# Patient Record
Sex: Male | Born: 1970 | Race: Black or African American | Hispanic: No | Marital: Married | Smoking: Current every day smoker
Health system: Southern US, Community
[De-identification: ages and names within clinical notes are randomized; demographics above are authoritative.]

## PROBLEM LIST (undated history)

## (undated) DIAGNOSIS — E785 Hyperlipidemia, unspecified: Secondary | ICD-10-CM

## (undated) DIAGNOSIS — L700 Acne vulgaris: Secondary | ICD-10-CM

## (undated) DIAGNOSIS — I10 Essential (primary) hypertension: Secondary | ICD-10-CM

## (undated) DIAGNOSIS — B019 Varicella without complication: Secondary | ICD-10-CM

## (undated) HISTORY — DX: Hyperlipidemia, unspecified: E78.5

## (undated) HISTORY — DX: Essential (primary) hypertension: I10

## (undated) HISTORY — PX: PILONIDAL CYST EXCISION: SHX744

## (undated) HISTORY — DX: Varicella without complication: B01.9

## (undated) HISTORY — DX: Acne vulgaris: L70.0

---

## 2004-06-21 ENCOUNTER — Ambulatory Visit: Payer: Self-pay | Admitting: Family Medicine

## 2004-07-05 ENCOUNTER — Ambulatory Visit: Payer: Self-pay | Admitting: Family Medicine

## 2006-05-14 ENCOUNTER — Ambulatory Visit: Payer: Self-pay | Admitting: Family Medicine

## 2007-04-29 ENCOUNTER — Ambulatory Visit: Payer: Self-pay | Admitting: Family Medicine

## 2007-04-29 DIAGNOSIS — I10 Essential (primary) hypertension: Secondary | ICD-10-CM | POA: Insufficient documentation

## 2007-04-29 DIAGNOSIS — Z9189 Other specified personal risk factors, not elsewhere classified: Secondary | ICD-10-CM | POA: Insufficient documentation

## 2007-04-29 DIAGNOSIS — H109 Unspecified conjunctivitis: Secondary | ICD-10-CM | POA: Insufficient documentation

## 2007-05-22 ENCOUNTER — Telehealth: Payer: Self-pay | Admitting: Family Medicine

## 2007-06-10 ENCOUNTER — Ambulatory Visit: Payer: Self-pay | Admitting: Family Medicine

## 2007-06-10 LAB — CONVERTED CEMR LAB
Glucose, Urine, Semiquant: NEGATIVE
Specific Gravity, Urine: 1.02

## 2007-06-11 ENCOUNTER — Telehealth: Payer: Self-pay | Admitting: Family Medicine

## 2007-06-11 LAB — CONVERTED CEMR LAB
BUN: 14 mg/dL (ref 6–23)
Basophils Relative: 0.3 % (ref 0.0–1.0)
Bilirubin, Direct: 0.2 mg/dL (ref 0.0–0.3)
CO2: 31 meq/L (ref 19–32)
Calcium: 9.9 mg/dL (ref 8.4–10.5)
Cholesterol: 219 mg/dL (ref 0–200)
Eosinophils Absolute: 0.2 10*3/uL (ref 0.0–0.6)
Eosinophils Relative: 2.7 % (ref 0.0–5.0)
GFR calc Af Amer: 96 mL/min
GFR calc non Af Amer: 80 mL/min
Glucose, Bld: 112 mg/dL — ABNORMAL HIGH (ref 70–99)
HDL: 32.1 mg/dL — ABNORMAL LOW (ref >39.0)
Hemoglobin: 14.7 g/dL (ref 13.0–17.0)
Lymphocytes Relative: 30.6 % (ref 12.0–46.0)
MCV: 88.7 fL (ref 78.0–100.0)
Monocytes Absolute: 0.8 10*3/uL — ABNORMAL HIGH (ref 0.2–0.7)
Monocytes Relative: 9.2 % (ref 3.0–11.0)
Neutro Abs: 4.8 10*3/uL (ref 1.4–7.7)
Platelets: 298 10*3/uL (ref 150–400)
Potassium: 4.2 meq/L (ref 3.5–5.1)
TSH: 1.32 microintl units/mL (ref 0.35–5.50)
Total Protein: 7.1 g/dL (ref 6.0–8.3)
VLDL: 31 mg/dL (ref 0–40)

## 2007-06-17 ENCOUNTER — Ambulatory Visit: Payer: Self-pay | Admitting: Family Medicine

## 2007-06-17 DIAGNOSIS — E1169 Type 2 diabetes mellitus with other specified complication: Secondary | ICD-10-CM | POA: Insufficient documentation

## 2007-06-17 DIAGNOSIS — E785 Hyperlipidemia, unspecified: Secondary | ICD-10-CM

## 2007-07-09 ENCOUNTER — Ambulatory Visit: Payer: Self-pay | Admitting: Family Medicine

## 2007-11-28 ENCOUNTER — Ambulatory Visit: Payer: Self-pay | Admitting: Family Medicine

## 2007-11-28 DIAGNOSIS — L723 Sebaceous cyst: Secondary | ICD-10-CM | POA: Insufficient documentation

## 2007-12-03 ENCOUNTER — Ambulatory Visit: Payer: Self-pay | Admitting: Family Medicine

## 2007-12-05 LAB — CONVERTED CEMR LAB
Cholesterol: 211 mg/dL (ref 0–200)
Triglycerides: 320 mg/dL (ref 0–149)

## 2008-05-14 ENCOUNTER — Ambulatory Visit: Payer: Self-pay | Admitting: Family Medicine

## 2008-05-14 DIAGNOSIS — E119 Type 2 diabetes mellitus without complications: Secondary | ICD-10-CM | POA: Insufficient documentation

## 2008-07-16 ENCOUNTER — Ambulatory Visit: Payer: Self-pay | Admitting: Family Medicine

## 2008-07-21 LAB — CONVERTED CEMR LAB
ALT: 26 units/L (ref 0–53)
Alkaline Phosphatase: 94 units/L (ref 39–117)
Basophils Absolute: 0 10*3/uL (ref 0.0–0.1)
Bilirubin, Direct: 0.1 mg/dL (ref 0.0–0.3)
CO2: 31 meq/L (ref 19–32)
Calcium: 10.2 mg/dL (ref 8.4–10.5)
Chloride: 105 meq/L (ref 96–112)
Cholesterol: 156 mg/dL (ref 0–200)
HDL: 32.3 mg/dL — ABNORMAL LOW (ref >39.0)
LDL Cholesterol: 102 mg/dL — ABNORMAL HIGH (ref 0–99)
Lymphocytes Relative: 29.6 % (ref 12.0–46.0)
MCHC: 34.8 g/dL (ref 30.0–36.0)
Microalb Creat Ratio: 4.4 mg/g (ref 0.0–30.0)
Microalb, Ur: 1.4 mg/dL (ref 0.0–1.9)
Neutro Abs: 5.5 10*3/uL (ref 1.4–7.7)
Neutrophils Relative %: 60.6 % (ref 43.0–77.0)
Platelets: 272 10*3/uL (ref 150–400)
Potassium: 3.9 meq/L (ref 3.5–5.1)
RDW: 12.7 % (ref 11.5–14.6)
Sodium: 142 meq/L (ref 135–145)
TSH: 1.06 microintl units/mL (ref 0.35–5.50)
Total Bilirubin: 1.2 mg/dL (ref 0.3–1.2)
Total CHOL/HDL Ratio: 4.8
Triglycerides: 108 mg/dL (ref 0–149)
VLDL: 22 mg/dL (ref 0–40)

## 2009-10-18 ENCOUNTER — Ambulatory Visit: Payer: Self-pay | Admitting: Family Medicine

## 2009-10-31 ENCOUNTER — Telehealth: Payer: Self-pay | Admitting: Family Medicine

## 2010-04-25 ENCOUNTER — Telehealth: Payer: Self-pay | Admitting: Family Medicine

## 2010-06-23 ENCOUNTER — Telehealth: Payer: Self-pay | Admitting: Family Medicine

## 2010-08-04 ENCOUNTER — Telehealth: Payer: Self-pay | Admitting: Family Medicine

## 2010-10-03 NOTE — Assessment & Plan Note (Signed)
Summary: high blood pressure/cdw   Vital Signs:  Patient profile:   40 year old male Weight:      274 pounds Temp:     98.2 degrees F oral Pulse rate:   70 / minute BP sitting:   184 / 122  (left arm) Cuff size:   large  Vitals Entered By: Alfred Levins, CMA (October 18, 2009 1:02 PM) CC: hypertension, not taking meds because he can't afford it   History of Present Illness: here to follow up on HTN. He lost his job over a year ago, and of course his health insurance as well. he stopped taking his meds some months ago. His BP at a pre-employment exam recently was over 200 systolic. he admits to some mild lightheadedness and HAs lately. No chest pains or SOB.   Current Medications (verified): 1)  Hydrochlorothiazide 25 Mg Tabs (Hydrochlorothiazide) .... Once Daily 2)  Azor 10-40 Mg  Tabs (Amlodipine-Olmesartan) .... Once Daily 3)  Zocor 40 Mg  Tabs (Simvastatin) .Marland Kitchen.. 1 By Mouth Once Daily  Allergies (verified): No Known Drug Allergies  Past History:  Past Medical History: Reviewed history from 07/16/2008 and no changes required. Hypertension Hyperlipidemia Diabetes mellitus, type II acne vulgaris  Review of Systems  The patient denies anorexia, fever, weight loss, weight gain, vision loss, decreased hearing, hoarseness, chest pain, syncope, dyspnea on exertion, peripheral edema, prolonged cough, hemoptysis, abdominal pain, melena, hematochezia, severe indigestion/heartburn, hematuria, incontinence, genital sores, muscle weakness, suspicious skin lesions, transient blindness, difficulty walking, depression, unusual weight change, abnormal bleeding, enlarged lymph nodes, angioedema, breast masses, and testicular masses.    Physical Exam  General:  Well-developed,well-nourished,in no acute distress; alert,appropriate and cooperative throughout examination Neck:  No deformities, masses, or tenderness noted. Lungs:  Normal respiratory effort, chest expands symmetrically. Lungs  are clear to auscultation, no crackles or wheezes. Heart:  Normal rate and regular rhythm. S1 and S2 normal without gallop, murmur, click, rub or other extra sounds.   Impression & Recommendations:  Problem # 1:  HYPERTENSION (ICD-401.9)  His updated medication list for this problem includes:    Hydrochlorothiazide 25 Mg Tabs (Hydrochlorothiazide) ..... Once daily    Azor 10-40 Mg Tabs (Amlodipine-olmesartan) ..... Once daily  Complete Medication List: 1)  Hydrochlorothiazide 25 Mg Tabs (Hydrochlorothiazide) .... Once daily 2)  Azor 10-40 Mg Tabs (Amlodipine-olmesartan) .... Once daily 3)  Zocor 40 Mg Tabs (Simvastatin) .Marland Kitchen.. 1 by mouth once daily  Patient Instructions: 1)  Given samples for Azor and he will get his other meds at the pharmacy since they are generic.  2)  Please schedule a follow-up appointment in 3 months . When he can afford it, he needs a cpx and labs. Prescriptions: AZOR 10-40 MG  TABS (AMLODIPINE-OLMESARTAN) once daily  #90 x 0   Entered by:   Alfred Levins, CMA   Authorized by:   Nelwyn Salisbury MD   Signed by:   Alfred Levins, CMA on 10/18/2009   Method used:   Samples Given   RxID:   [id number]

## 2010-10-03 NOTE — Progress Notes (Signed)
Summary: rx azor   Phone Note Call from Patient   Summary of Call: Samples of Azor. [phone] Initial call taken by: Lynann Beaver CMA AAMA,  June 23, 2010 3:58 PM  Follow-up for Phone Call        samples out front  pt aware.  pt requested rx sent to [facility] [address]  Follow-up by: Pura Spice, RN,  June 23, 2010 4:06 PM    New/Updated Medications: AZOR 10-40 MG  TABS (AMLODIPINE-OLMESARTAN) once daily needs to be seen Prescriptions: AZOR 10-40 MG  TABS (AMLODIPINE-OLMESARTAN) once daily needs to be seen  #30 x 0   Entered by:   Pura Spice, RN   Authorized by:   Nelwyn Salisbury MD   Signed by:   Pura Spice, RN on 06/23/2010   Method used:   Electronically to        [facility] [address] (retail)       [phone]       [place], [place]  [address]       Ph: [phone]       Fax: [phone]   RxID:   [id number]

## 2010-10-03 NOTE — Progress Notes (Signed)
Summary: samples  Phone Note Call from Patient Call back at [phone]   Caller: Patient Summary of Call: asking for samples of Azor 10/40  Follow-up for Phone Call        please find him some samples Follow-up by: Nelwyn Salisbury MD,  April 26, 2010 1:30 PM  Additional Follow-up for Phone Call Additional follow up Details #1::        called. Additional Follow-up by: Raechel Ache, RN,  April 26, 2010 1:43 PM

## 2010-10-03 NOTE — Progress Notes (Signed)
Summary: refill doxycycline  Phone Note From Pharmacy   Caller: [facility] [address] Call For: Clent Ridges  Summary of Call: refill doxycycline 100mg  1 by mouth two times a day Initial call taken by: Alfred Levins, CMA,  October 31, 2009 11:57 AM  Follow-up for Phone Call        call in #60 with 2 rf Follow-up by: Nelwyn Salisbury MD,  October 31, 2009 4:47 PM  Additional Follow-up for Phone Call Additional follow up Details #1::        Phone call completed, Pharmacist called Additional Follow-up by: Alfred Levins, CMA,  October 31, 2009 4:56 PM    New/Updated Medications: DOXYCYCLINE HYCLATE 100 MG CAPS (DOXYCYCLINE HYCLATE) Take 1 tab twice a day Prescriptions: DOXYCYCLINE HYCLATE 100 MG CAPS (DOXYCYCLINE HYCLATE) Take 1 tab twice a day  #60 x 2   Entered by:   Alfred Levins, CMA   Authorized by:   Nelwyn Salisbury MD   Signed by:   Alfred Levins, CMA on 10/31/2009   Method used:   Electronically to        [facility] [address] (retail)       [phone]       [place], [place]  [address]       Ph: [phone]       Fax: [phone]   RxID:   [id number]

## 2010-10-03 NOTE — Progress Notes (Signed)
Summary: switch med  Phone Note Call from Patient   Caller: Patient Call For: Nelwyn Salisbury MD Summary of Call: Pt would like equivalent of azor 10-40 he is willing to take 2 pills instead of one pill due to cost, please call [facility] Initial call taken by: Heron Sabins,  August 04, 2010 11:09 AM  Follow-up for Phone Call        stop Azor. Call in Amlodipine 10 mg once daily and Losartan 100 mg once daily , #30 of each with 11 rf  Follow-up by: Nelwyn Salisbury MD,  August 04, 2010 11:42 AM  Additional Follow-up for Phone Call Additional follow up Details #1::        Not able to inform pt, phone no longer in service Additional Follow-up by: Sid Falcon LPN,  August 04, 2010 2:08 PM    New/Updated Medications: LOSARTAN POTASSIUM 100 MG TABS (LOSARTAN POTASSIUM) once daily AMLODIPINE BESYLATE 10 MG TABS (AMLODIPINE BESYLATE) once daily Prescriptions: LOSARTAN POTASSIUM 100 MG TABS (LOSARTAN POTASSIUM) once daily  #30 x 11   Entered by:   Sid Falcon LPN   Authorized by:   Nelwyn Salisbury MD   Signed by:   Sid Falcon LPN on 13/24/4010   Method used:   Electronically to        [facility] [address] (retail)       [phone]       [place], [place]  [address]       Ph: [phone]       Fax: [phone]   RxID:   [id number] AMLODIPINE BESYLATE 10 MG TABS (AMLODIPINE BESYLATE) once daily  #30 x 11   Entered by:   Sid Falcon LPN   Authorized by:   Nelwyn Salisbury MD   Signed by:   Sid Falcon LPN on 09/32/3557   Method used:   Electronically to        [facility] [address] (retail)       [phone]       [place], [place]  [address]       Ph: [phone]       Fax: [phone]   RxID:   [id number]

## 2010-11-16 ENCOUNTER — Other Ambulatory Visit: Payer: Self-pay | Admitting: Family Medicine

## 2010-12-25 ENCOUNTER — Telehealth: Payer: Self-pay | Admitting: Family Medicine

## 2010-12-25 NOTE — Telephone Encounter (Signed)
Rx refill request for: Doxycycline Hyc 100mg  Cap #60 Please Advise.

## 2010-12-26 NOTE — Telephone Encounter (Signed)
We need to know what this is for

## 2010-12-26 NOTE — Telephone Encounter (Signed)
Pharmacy states Pt p/u partial order on original order & is trying now to p/u remaining portion on refill. Pharmacy advised of Denial on this order & inform Pt that OV would be needed before ABI Rx could be filled.

## 2011-04-09 ENCOUNTER — Other Ambulatory Visit: Payer: Self-pay | Admitting: Family Medicine

## 2011-05-16 ENCOUNTER — Other Ambulatory Visit: Payer: Self-pay | Admitting: Family Medicine

## 2011-05-17 NOTE — Telephone Encounter (Signed)
Electronic refill request for HCTZ & Zocor. Last office visit was on 10/2009 and pt has a non working phone number. Please advise?

## 2011-05-18 ENCOUNTER — Telehealth: Payer: Self-pay | Admitting: Family Medicine

## 2011-05-18 MED ORDER — SIMVASTATIN 40 MG PO TABS: 40.0000 mg | ORAL_TABLET | Freq: Every day | ORAL | Status: DC

## 2011-05-18 MED ORDER — HYDROCHLOROTHIAZIDE 25 MG PO TABS: 25.0000 mg | ORAL_TABLET | Freq: Every day | ORAL | Status: DC

## 2011-05-18 NOTE — Telephone Encounter (Signed)
Refill request for Simvastatin, script sent e-scribe and pt needs a office visit. I tried to call but no working #.

## 2011-05-18 NOTE — Telephone Encounter (Signed)
Refill request for HCTZ, script sent e-scribe. Pt needs a office visit, I tried to call but no working #.

## 2011-06-25 ENCOUNTER — Other Ambulatory Visit: Payer: Self-pay | Admitting: Family Medicine

## 2011-06-26 NOTE — Telephone Encounter (Signed)
Can we refill? 

## 2011-07-07 ENCOUNTER — Other Ambulatory Visit: Payer: Self-pay | Admitting: Family Medicine

## 2011-07-24 ENCOUNTER — Telehealth: Payer: Self-pay | Admitting: Family Medicine

## 2011-07-24 NOTE — Telephone Encounter (Signed)
Pt i scheduled for a meds check on 07/31/11 but is currently out of hydrochlorothiazide (HYDRODIURIL) 25 MG tablet and simvastatin (ZOCOR) 40 MG tablet pt requesting refill   [facility] [address]

## 2011-07-25 MED ORDER — HYDROCHLOROTHIAZIDE 25 MG PO TABS: 25.0000 mg | ORAL_TABLET | Freq: Every day | ORAL | Status: DC

## 2011-07-25 MED ORDER — SIMVASTATIN 40 MG PO TABS: 40.0000 mg | ORAL_TABLET | Freq: Every day | ORAL | Status: DC

## 2011-07-25 NOTE — Telephone Encounter (Signed)
rx sent into pharmacy

## 2011-07-31 ENCOUNTER — Ambulatory Visit: Payer: Self-pay | Admitting: Family Medicine

## 2011-08-07 ENCOUNTER — Ambulatory Visit: Payer: Self-pay | Admitting: Family Medicine

## 2011-09-12 ENCOUNTER — Other Ambulatory Visit: Payer: Self-pay | Admitting: Family Medicine

## 2011-09-13 NOTE — Telephone Encounter (Signed)
Call in one month of each with no further refills. He needs an OV for any more

## 2011-09-13 NOTE — Telephone Encounter (Signed)
Pt last seen 10/18/09. Pls advise.

## 2011-11-07 ENCOUNTER — Telehealth: Payer: Self-pay | Admitting: Family Medicine

## 2011-11-07 NOTE — Telephone Encounter (Signed)
Pt has sch ov for 11/26/11 to see pcp. Pt is going to need refills for amLODipine (NORVASC) 10 MG tablet ,hydrochlorothiazide (HYDRODIURIL) 25 MG tablet ,losartan (COZAAR) 100 MG tablet ,simvastatin (ZOCOR) 40 MG tablet to [facility] [address].

## 2011-11-12 MED ORDER — LOSARTAN POTASSIUM 100 MG PO TABS: 100.0000 mg | ORAL_TABLET | Freq: Every day | ORAL | Status: DC

## 2011-11-12 MED ORDER — HYDROCHLOROTHIAZIDE 25 MG PO TABS: 25.0000 mg | ORAL_TABLET | Freq: Every day | ORAL | Status: DC

## 2011-11-12 MED ORDER — SIMVASTATIN 40 MG PO TABS: 40.0000 mg | ORAL_TABLET | Freq: Every day | ORAL | Status: DC

## 2011-11-12 MED ORDER — AMLODIPINE BESYLATE 10 MG PO TABS: 10.0000 mg | ORAL_TABLET | Freq: Every day | ORAL | Status: DC

## 2011-11-12 NOTE — Telephone Encounter (Signed)
All 3 scripts sent e-scribe, Dr. Clent Ridges did approve this.

## 2011-11-26 ENCOUNTER — Encounter: Payer: Self-pay | Admitting: Family Medicine

## 2011-11-26 ENCOUNTER — Ambulatory Visit: Payer: Self-pay | Admitting: Family Medicine

## 2011-11-26 DIAGNOSIS — Z0289 Encounter for other administrative examinations: Secondary | ICD-10-CM

## 2013-01-09 ENCOUNTER — Other Ambulatory Visit: Payer: Self-pay

## 2013-01-09 ENCOUNTER — Encounter (HOSPITAL_BASED_OUTPATIENT_CLINIC_OR_DEPARTMENT_OTHER): Payer: Self-pay | Admitting: *Deleted

## 2013-01-09 ENCOUNTER — Emergency Department (HOSPITAL_BASED_OUTPATIENT_CLINIC_OR_DEPARTMENT_OTHER)
Admission: EM | Admit: 2013-01-09 | Discharge: 2013-01-09 | Disposition: A | Discharge status: Home / Self Care | Payer: Self-pay | Attending: Emergency Medicine | Admitting: Emergency Medicine

## 2013-01-09 DIAGNOSIS — Z91199 Patient's noncompliance with other medical treatment and regimen due to unspecified reason: Secondary | ICD-10-CM | POA: Insufficient documentation

## 2013-01-09 DIAGNOSIS — Z9119 Patient's noncompliance with other medical treatment and regimen: Secondary | ICD-10-CM | POA: Insufficient documentation

## 2013-01-09 DIAGNOSIS — F1021 Alcohol dependence, in remission: Secondary | ICD-10-CM | POA: Insufficient documentation

## 2013-01-09 DIAGNOSIS — I10 Essential (primary) hypertension: Secondary | ICD-10-CM | POA: Insufficient documentation

## 2013-01-09 DIAGNOSIS — F17200 Nicotine dependence, unspecified, uncomplicated: Secondary | ICD-10-CM | POA: Insufficient documentation

## 2013-01-09 DIAGNOSIS — Z9114 Patient's other noncompliance with medication regimen: Secondary | ICD-10-CM

## 2013-01-09 DIAGNOSIS — R42 Dizziness and giddiness: Secondary | ICD-10-CM | POA: Insufficient documentation

## 2013-01-09 DIAGNOSIS — E119 Type 2 diabetes mellitus without complications: Secondary | ICD-10-CM | POA: Insufficient documentation

## 2013-01-09 DIAGNOSIS — Z79899 Other long term (current) drug therapy: Secondary | ICD-10-CM | POA: Insufficient documentation

## 2013-01-09 DIAGNOSIS — E785 Hyperlipidemia, unspecified: Secondary | ICD-10-CM | POA: Insufficient documentation

## 2013-01-09 MED ORDER — AMLODIPINE BESYLATE 5 MG PO TABS
10.0000 mg | ORAL_TABLET | Freq: Once | ORAL | Status: AC
  Administered 2013-01-09: 10 mg via ORAL
  Filled 2013-01-09: qty 2

## 2013-01-09 MED ORDER — HYDROCHLOROTHIAZIDE 25 MG PO TABS
25.0000 mg | ORAL_TABLET | Freq: Once | ORAL | Status: AC
  Administered 2013-01-09: 25 mg via ORAL
  Filled 2013-01-09: qty 1

## 2013-01-09 MED ORDER — LOSARTAN POTASSIUM-HCTZ 100-25 MG PO TABS: 1.0000 | ORAL_TABLET | Freq: Every day | ORAL | Status: DC

## 2013-01-09 MED ORDER — AMLODIPINE BESYLATE 5 MG PO TABS: 5.0000 mg | ORAL_TABLET | Freq: Every day | ORAL | Status: DC

## 2013-01-09 MED ORDER — LOSARTAN POTASSIUM 50 MG PO TABS
100.0000 mg | ORAL_TABLET | Freq: Once | ORAL | Status: DC
  Filled 2013-01-09: qty 2

## 2013-01-09 NOTE — ED Provider Notes (Signed)
History     CSN: [id number]  Arrival date & time 01/09/13  1204   First MD Initiated Contact with Patient 01/09/13 1235      Chief Complaint  Patient presents with  . Hypertension    (Consider location/radiation/quality/duration/timing/severity/associated sxs/prior treatment) The history is provided by the patient.   patient reports medication noncompliance with his hypertension meds.  Today he felt slightly more lightheaded when running to the top of the steps.  He also felt "dizzy".  This was described more as lightheadedness.  No vertiginous symptoms.  No difficulty with stability her gait.  He denies chest pain or shortness of breath.  He denies abdominal pain.  No nausea vomiting or diarrhea.  No new exertional shortness of breath.  No orthopnea.  No prior history of cardiac disease.  He is currently working 2 jobs and states that his sleep habits have been poor.  His symptoms are mild.  Nothing improves or worsens her symptoms.  Past Medical History  Diagnosis Date  . Hypertension   . Hyperlipidemia   . Diabetes mellitus   . Acne vulgaris     Past Surgical History  Procedure Laterality Date  . Pilonidal cyst excision      removal    Family History  Problem Relation Age of Onset  . Alcohol abuse    . Diabetes    . Hyperlipidemia    . Hypertension    . Stroke      History  Substance Use Topics  . Smoking status: Current Every Day Smoker -- 1.00 packs/day    Types: Cigarettes  . Smokeless tobacco: Never Used  . Alcohol Use: Yes     Comment: socially      Review of Systems  All other systems reviewed and are negative.    Allergies  Review of patient's allergies indicates no known allergies.  Home Medications   Current Outpatient Rx  Name  Route  Sig  Dispense  Refill  . amLODipine (NORVASC) 10 MG tablet   Oral   Take 1 tablet (10 mg total) by mouth daily.   30 tablet   0   . hydrochlorothiazide (HYDRODIURIL) 25 MG tablet   Oral   Take 1 tablet  (25 mg total) by mouth daily.   30 tablet   0   . losartan (COZAAR) 100 MG tablet   Oral   Take 1 tablet (100 mg total) by mouth daily.   30 tablet   0   . simvastatin (ZOCOR) 40 MG tablet   Oral   Take 1 tablet (40 mg total) by mouth daily.   30 tablet   0     Must be seen for future refills     BP 187/133  Temp(Src) 98.6 F (37 C) (Oral)  Resp 20  Ht 6\' 7"  (2.007 m)  Wt 284 lb (128.822 kg)  BMI 31.98 kg/m2  SpO2 98%  Physical Exam  Nursing note and vitals reviewed. Constitutional: He is oriented to person, place, and time. He appears well-developed and well-nourished.  HENT:  Head: Normocephalic and atraumatic.  Eyes: EOM are normal. Pupils are equal, round, and reactive to light.  Neck: Normal range of motion.  Cardiovascular: Normal rate, regular rhythm, normal heart sounds and intact distal pulses.   Pulmonary/Chest: Effort normal and breath sounds normal. No respiratory distress.  Abdominal: Soft. He exhibits no distension. There is no tenderness.  Genitourinary: Rectum normal.  Musculoskeletal: Normal range of motion.  Neurological: He is alert and oriented  to person, place, and time.  5/5 strength in major muscle groups of  bilateral upper and lower extremities. Speech normal. No facial asymetry.  Gait normal.  Normal tandem gait  Skin: Skin is warm and dry.  Psychiatric: He has a normal mood and affect. Judgment normal.    ED Course  Procedures (including critical care time)   Date: 01/09/2013  Rate: 76  Rhythm: normal sinus rhythm  QRS Axis: normal  Intervals: normal  ST/T Wave abnormalities: normal  Conduction Disutrbances: none  Narrative Interpretation:   Old EKG Reviewed: No significant changes noted     Labs Reviewed - No data to display No results found.   1. Hypertension   2. H/O medication noncompliance       MDM  Hypertension without significant symptoms.  Normal neurologic exam.  Doubt intracranial bleed.  No indication for  imaging.  No chest pain or shortness of breath.  Urinating normally.  Medication noncompliance.  Blood pressure meds were restarted in the emergency department.  Blood pressure seems to be improving with his last blood pressure 186/113.  He understands now the importance of close blood pressure management and compliance with medications.  He understands to return to the ER for new or worsening symptoms        Lyanne Co, MD 01/09/13 1348

## 2013-01-09 NOTE — ED Notes (Signed)
MD at bedside. 

## 2013-01-09 NOTE — ED Notes (Signed)
Pt states he had a headache over his left eye that woke him from his sleep around 0300. Pt took two 500mg  tylenol last night. Pt denies vision changes or weakness.

## 2013-01-09 NOTE — ED Notes (Signed)
Patient states he has not taken his blood pressure in the last 3 months.  States he had been very busy and has not made an appointment to get his refills.

## 2013-01-11 ENCOUNTER — Emergency Department (HOSPITAL_COMMUNITY)
Admission: EM | Admit: 2013-01-11 | Discharge: 2013-01-11 | Disposition: A | Discharge status: Home / Self Care | Payer: Self-pay | Attending: Emergency Medicine | Admitting: Emergency Medicine

## 2013-01-11 ENCOUNTER — Encounter (HOSPITAL_COMMUNITY): Payer: Self-pay | Admitting: *Deleted

## 2013-01-11 ENCOUNTER — Emergency Department (HOSPITAL_COMMUNITY): Payer: Self-pay

## 2013-01-11 DIAGNOSIS — R112 Nausea with vomiting, unspecified: Secondary | ICD-10-CM | POA: Insufficient documentation

## 2013-01-11 DIAGNOSIS — E785 Hyperlipidemia, unspecified: Secondary | ICD-10-CM | POA: Insufficient documentation

## 2013-01-11 DIAGNOSIS — Z79899 Other long term (current) drug therapy: Secondary | ICD-10-CM | POA: Insufficient documentation

## 2013-01-11 DIAGNOSIS — E119 Type 2 diabetes mellitus without complications: Secondary | ICD-10-CM | POA: Insufficient documentation

## 2013-01-11 DIAGNOSIS — F17200 Nicotine dependence, unspecified, uncomplicated: Secondary | ICD-10-CM | POA: Insufficient documentation

## 2013-01-11 DIAGNOSIS — I10 Essential (primary) hypertension: Secondary | ICD-10-CM | POA: Insufficient documentation

## 2013-01-11 DIAGNOSIS — R42 Dizziness and giddiness: Secondary | ICD-10-CM | POA: Insufficient documentation

## 2013-01-11 LAB — COMPREHENSIVE METABOLIC PANEL
BUN: 15 mg/dL (ref 6–23)
Calcium: 9.8 mg/dL (ref 8.4–10.5)
Creatinine, Ser: 0.91 mg/dL (ref 0.50–1.35)
GFR calc Af Amer: >90 mL/min (ref >90)
Glucose, Bld: 166 mg/dL — ABNORMAL HIGH (ref 70–99)
Total Protein: 8.1 g/dL (ref 6.0–8.3)

## 2013-01-11 LAB — CBC WITH DIFFERENTIAL/PLATELET
Eosinophils Absolute: 0.1 10*3/uL (ref 0.0–0.7)
Eosinophils Relative: 1 % (ref 0–5)
Hemoglobin: 17.2 g/dL — ABNORMAL HIGH (ref 13.0–17.0)
Lymphs Abs: 2.5 10*3/uL (ref 0.7–4.0)
MCH: 31.2 pg (ref 26.0–34.0)
MCV: 85.3 fL (ref 78.0–100.0)
Monocytes Relative: 6 % (ref 3–12)
RBC: 5.52 MIL/uL (ref 4.22–5.81)

## 2013-01-11 MED ORDER — SODIUM CHLORIDE 0.9 % IV BOLUS (SEPSIS): 500.0000 mL | Freq: Once | INTRAVENOUS | Status: DC

## 2013-01-11 MED ORDER — MECLIZINE HCL 25 MG PO TABS
25.0000 mg | ORAL_TABLET | Freq: Once | ORAL | Status: AC
  Administered 2013-01-11: 25 mg via ORAL
  Filled 2013-01-11: qty 1

## 2013-01-11 MED ORDER — MECLIZINE HCL 25 MG PO TABS: 25.0000 mg | ORAL_TABLET | Freq: Three times a day (TID) | ORAL | Status: DC | PRN

## 2013-01-11 MED ORDER — ONDANSETRON 4 MG PO TBDP: 4.0000 mg | ORAL_TABLET | Freq: Three times a day (TID) | ORAL | Status: DC | PRN

## 2013-01-11 MED ORDER — SODIUM CHLORIDE 0.9 % IV BOLUS (SEPSIS)
1000.0000 mL | Freq: Once | INTRAVENOUS | Status: AC
  Administered 2013-01-11: 1000 mL via INTRAVENOUS

## 2013-01-11 MED ORDER — ONDANSETRON HCL 4 MG/2ML IJ SOLN
4.0000 mg | Freq: Once | INTRAMUSCULAR | Status: AC
  Administered 2013-01-11: 4 mg via INTRAVENOUS
  Filled 2013-01-11: qty 2

## 2013-01-11 MED ORDER — SODIUM CHLORIDE 0.9 % IV SOLN
INTRAVENOUS | Status: DC
  Administered 2013-01-11: 03:00:00 via INTRAVENOUS

## 2013-01-11 NOTE — ED Notes (Signed)
Went to work yesterday, start feeling dizzy, and throwing up a lot. Hx. Of htn. Taking hbp meds.

## 2013-01-11 NOTE — ED Provider Notes (Signed)
History     CSN: [id number]  Arrival date & time 01/11/13  0014   First MD Initiated Contact with Patient 01/11/13 0058      Chief Complaint  Patient presents with  . Hypertension  . Emesis  . Dizziness    (Consider location/radiation/quality/duration/timing/severity/associated sxs/prior treatment) Patient is a 42 y.o. male presenting with hypertension and vomiting. The history is provided by the patient and the spouse.  Hypertension Pertinent negatives include no chest pain, no abdominal pain, no headaches and no shortness of breath.  Emesis Associated symptoms: no abdominal pain and no headaches    patient with acute onset of dizziness vertigo room spinning on Friday which would be yesterday in the morning. Today is Saturday around 9 in the evening started with vomiting. Dizziness is made worse by moving his head. He's had no cold symptoms. No head injury. No hearing deficit or ringing in the ears. Patient's never had vertigo or diseases in the past. Patient primary care doctors Dr. Tresa Endo at [facility]. Patient denies fever also denies any strokelike symptoms.  Past Medical History  Diagnosis Date  . Hypertension   . Hyperlipidemia   . Diabetes mellitus   . Acne vulgaris     Past Surgical History  Procedure Laterality Date  . Pilonidal cyst excision      removal    Family History  Problem Relation Age of Onset  . Alcohol abuse    . Diabetes    . Hyperlipidemia    . Hypertension    . Stroke      History  Substance Use Topics  . Smoking status: Current Every Day Smoker -- 1.00 packs/day    Types: Cigarettes  . Smokeless tobacco: Never Used  . Alcohol Use: Yes     Comment: socially      Review of Systems  Constitutional: Negative for fever.  HENT: Negative for ear pain, congestion, neck pain and tinnitus.   Respiratory: Negative for cough and shortness of breath.   Cardiovascular: Negative for chest pain.  Gastrointestinal: Positive for nausea and  vomiting. Negative for abdominal pain.  Genitourinary: Negative for dysuria.  Musculoskeletal: Negative for back pain.  Skin: Negative for rash.  Neurological: Positive for dizziness. Negative for syncope, speech difficulty, weakness and headaches.  Hematological: Does not bruise/bleed easily.  Psychiatric/Behavioral: Negative for confusion.    Allergies  Review of patient's allergies indicates no known allergies.  Home Medications   Current Outpatient Rx  Name  Route  Sig  Dispense  Refill  . acetaminophen (TYLENOL) 500 MG tablet   Oral   Take 500 mg by mouth every 6 (six) hours as needed for pain.         Marland Kitchen amLODipine (NORVASC) 5 MG tablet   Oral   Take 1 tablet (5 mg total) by mouth daily.   30 tablet   1   . losartan-hydrochlorothiazide (HYZAAR) 100-25 MG per tablet   Oral   Take 1 tablet by mouth daily.   30 tablet   1   . meclizine (ANTIVERT) 25 MG tablet   Oral   Take 1 tablet (25 mg total) by mouth 3 (three) times daily as needed.   30 tablet   0   . ondansetron (ZOFRAN ODT) 4 MG disintegrating tablet   Oral   Take 1 tablet (4 mg total) by mouth every 8 (eight) hours as needed for nausea.   12 tablet   0     BP 145/93  Pulse 65  Temp(Src)  97.6 F (36.4 C) (Oral)  Resp 16  SpO2 94%  Physical Exam  Nursing note and vitals reviewed. Constitutional: He is oriented to person, place, and time. He appears well-developed and well-nourished. No distress.  HENT:  Head: Normocephalic and atraumatic.  Right Ear: External ear normal.  Left Ear: External ear normal.  Mouth/Throat: Oropharynx is clear and moist.  Eyes: Conjunctivae and EOM are normal. Pupils are equal, round, and reactive to light.  Neck: Normal range of motion. Neck supple.  Cardiovascular: Normal rate, regular rhythm, normal heart sounds and intact distal pulses.   No murmur heard. Pulmonary/Chest: Effort normal and breath sounds normal.  Abdominal: Soft. Bowel sounds are normal. There  is no tenderness.  Musculoskeletal: Normal range of motion. He exhibits no edema.  Lymphadenopathy:    He has no cervical adenopathy.  Neurological: He is alert and oriented to person, place, and time. No cranial nerve deficit. He exhibits normal muscle tone. Coordination normal.  Dizziness is reproducible with movement of the head back and forth. The skull as room spinning consistent with vertigo.  Skin: Skin is warm. No rash noted.    ED Course  Procedures (including critical care time)  Labs Reviewed  CBC WITH DIFFERENTIAL - Abnormal; Notable for the following:    WBC 14.1 (*)    Hemoglobin 17.2 (*)    MCHC 36.5 (*)    Neutro Abs 10.7 (*)    All other components within normal limits  COMPREHENSIVE METABOLIC PANEL - Abnormal; Notable for the following:    Glucose, Bld 166 (*)    All other components within normal limits   Ct Head Wo Contrast  01/11/2013  *RADIOLOGY REPORT*  Clinical Data: Headache, dizziness and vomiting.  CT HEAD WITHOUT CONTRAST  Technique:  Contiguous axial images were obtained from the base of the skull through the vertex without contrast.  Comparison: None.  Findings: The brain appears normal without infarct, hemorrhage, mass lesion, mass effect, midline shift or abnormal extra-axial fluid collection.  A large mucous retention cyst or polyp is seen in the left maxillary sinus.  The calvarium is intact.  IMPRESSION:  1.  No acute intracranial abnormality. 2.  Large mucous retention cyst or polyp left maxillary sinus.   Original Report Authenticated By: Holley Dexter, M.D.    Results for orders placed during the hospital encounter of 01/11/13  CBC WITH DIFFERENTIAL      Result Value Range   WBC 14.1 (*) 4.0 - 10.5 K/uL   RBC 5.52  4.22 - 5.81 MIL/uL   Hemoglobin 17.2 (*) 13.0 - 17.0 g/dL   HCT 28.4  13.2 - 44.0 %   MCV 85.3  78.0 - 100.0 fL   MCH 31.2  26.0 - 34.0 pg   MCHC 36.5 (*) 30.0 - 36.0 g/dL   RDW 10.2  72.5 - 36.6 %   Platelets 262  150 - 400  K/uL   Neutrophils Relative 76  43 - 77 %   Neutro Abs 10.7 (*) 1.7 - 7.7 K/uL   Lymphocytes Relative 18  12 - 46 %   Lymphs Abs 2.5  0.7 - 4.0 K/uL   Monocytes Relative 6  3 - 12 %   Monocytes Absolute 0.8  0.1 - 1.0 K/uL   Eosinophils Relative 1  0 - 5 %   Eosinophils Absolute 0.1  0.0 - 0.7 K/uL   Basophils Relative 0  0 - 1 %   Basophils Absolute 0.0  0.0 - 0.1 K/uL  COMPREHENSIVE  METABOLIC PANEL      Result Value Range   Sodium 135  135 - 145 mEq/L   Potassium 3.6  3.5 - 5.1 mEq/L   Chloride 98  96 - 112 mEq/L   CO2 25  19 - 32 mEq/L   Glucose, Bld 166 (*) 70 - 99 mg/dL   BUN 15  6 - 23 mg/dL   Creatinine, Ser 1.61  0.50 - 1.35 mg/dL   Calcium 9.8  8.4 - 09.6 mg/dL   Total Protein 8.1  6.0 - 8.3 g/dL   Albumin 4.6  3.5 - 5.2 g/dL   AST 19  0 - 37 U/L   ALT 28  0 - 53 U/L   Alkaline Phosphatase 109  39 - 117 U/L   Total Bilirubin 1.1  0.3 - 1.2 mg/dL   GFR calc non Af Amer >90  >90 mL/min   GFR calc Af Amer >90  >90 mL/min     1. Vertigo       MDM  Patient with onset of dizziness and vertigo Friday morning yesterday. Today at around the eye in the evening started with the vomiting. There is a degree of room spinning making this consistent with vertigo is made worse by moving his head. Patient never had pain like this before has no ringing in the ears or hearing deficit. No evidence of upper respiratory infection perhaps explain a viral vertigo. Patient has a history of hypertension that improved here in the emergency apartment once the vertigo and nausea was under control. The no head injury. No severe headache or head pain. Head CT without any acute findings. Labs without a sniffing abnormalities other than a leukocytosis on into the possibility of a viral infection as the cause. Head CT showed no evidence of tumor or stroke. Clinically do not feel that the vertigo is stroke related. Patient improved with Antivert antinausea medicine fluids. Patient has primary care Dr. to  followup with on Monday patient we discharged home with Antivert and antinausea medicine. Suspect this is benign positional vertigo with a viral cause.        Shelda Jakes, MD 01/11/13 [phone]

## 2013-01-11 NOTE — ED Notes (Signed)
Pt states understanding of discharge instructions 

## 2013-01-11 NOTE — ED Notes (Signed)
Patient presents c/o feeling dizzy.  If he turns his head to the right the dizziness gets worse.  Vomited tonight.

## 2016-10-02 ENCOUNTER — Encounter: Payer: Self-pay | Admitting: Family Medicine

## 2016-10-02 ENCOUNTER — Ambulatory Visit (INDEPENDENT_AMBULATORY_CARE_PROVIDER_SITE_OTHER): Payer: 59 | Admitting: Family Medicine

## 2016-10-02 VITALS — BP 168/104 | HR 101 | Temp 98.1°F | Ht 78.0 in | Wt 289.4 lb

## 2016-10-02 DIAGNOSIS — I10 Essential (primary) hypertension: Secondary | ICD-10-CM | POA: Diagnosis not present

## 2016-10-02 DIAGNOSIS — E119 Type 2 diabetes mellitus without complications: Secondary | ICD-10-CM

## 2016-10-02 DIAGNOSIS — B360 Pityriasis versicolor: Secondary | ICD-10-CM

## 2016-10-02 DIAGNOSIS — E785 Hyperlipidemia, unspecified: Secondary | ICD-10-CM | POA: Diagnosis not present

## 2016-10-02 DIAGNOSIS — L723 Sebaceous cyst: Secondary | ICD-10-CM

## 2016-10-02 DIAGNOSIS — F17200 Nicotine dependence, unspecified, uncomplicated: Secondary | ICD-10-CM

## 2016-10-02 MED ORDER — CICLOPIROX OLAMINE 0.77 % EX CREA: 1.0000 "application " | TOPICAL_CREAM | Freq: Two times a day (BID) | CUTANEOUS | 1 refills | Status: DC

## 2016-10-02 MED ORDER — NEBIVOLOL HCL 5 MG PO TABS: 5.0000 mg | ORAL_TABLET | Freq: Every day | ORAL | 5 refills | Status: DC

## 2016-10-02 MED FILL — BYSTOLIC 5 MG TABLET: 5 | 30 days supply | Qty: 30 | Fill #0

## 2016-10-02 MED FILL — CICLOPIROX 0.77% CREAM: 0.77 | 21 days supply | Qty: 90 | Fill #0

## 2016-10-02 NOTE — Progress Notes (Signed)
Pre visit review using our clinic review tool, if applicable. No additional management support is needed unless otherwise documented below in the visit note. 

## 2016-10-02 NOTE — Assessment & Plan Note (Signed)
S: reports history of high blood sugar years ago but then he lost some weight and started exercising and became controlled. Noted in problem listed idabetes from 05/14/2008- I am able to see note from that day but no a1c noted and unclear what diagnosis was based on Lab Results  Component Value Date   HGBA1C 6.0 07/16/2008  A/P: I do not have records of any elevated a1c. Will get updated a1c. I would consider changing this code to hyperglycemia depending on findgs.

## 2016-10-02 NOTE — Patient Instructions (Addendum)
Schedule a phyiscal in next 2-3 months with labs a few days before- have them include diabetic labs including a1c as well as PSA for prostate cancer screening. Based on last # from 2009- your diabetes was REALLY well controlled without medications and hopeful that is still the cae  We will call you within a week or two about your referral to eye doctor for updated diabetic eye exam. If you do not hear within 3 weeks, give [place] a call.   Add bystolic 5mg  to your blood pressure regimen- continue both other medicines. Your blood pressure trend concerns me. I would like for you to buy/use a home cuff to check at least 5-7x a week. Your goal is <140/90. Update me in 2-3 weeks by mychart. Bring your home cuff and your log of blood pressures with you to visit. Exercise and healthy eating (dash plan) can help  Refilled Ciclopirox- use 2 weeks past last lesion noted  Consider cutting down to 2mg  gum and adding 7mg  patch for baseline control of cravings  DASH Eating Plan DASH stands for "Dietary Approaches to Stop Hypertension." The DASH eating plan is a healthy eating plan that has been shown to reduce high blood pressure (hypertension). Additional health benefits may include reducing the risk of type 2 diabetes mellitus, heart disease, and stroke. The DASH eating plan may also help with weight loss. What do I need to know about the DASH eating plan? For the DASH eating plan, you will follow these general guidelines:  Choose foods with less than 150 milligrams of sodium per serving (as listed on the food label).  Use salt-free seasonings or herbs instead of table salt or sea salt.  Check with your health care provider or pharmacist before using salt substitutes.  Eat lower-sodium products. These are often labeled as "low-sodium" or "no salt added."  Eat fresh foods. Avoid eating a lot of canned foods.  Eat more vegetables, fruits, and low-fat dairy products.  Choose whole grains. Look for the word  "whole" as the first word in the ingredient list.  Choose fish and skinless chicken or [place] more often than red meat. Limit fish, poultry, and meat to 6 oz (170 g) each day.  Limit sweets, desserts, sugars, and sugary drinks.  Choose heart-healthy fats.  Eat more home-cooked food and less restaurant, buffet, and fast food.  Limit fried foods.  Do not fry foods. Cook foods using methods such as baking, boiling, grilling, and broiling instead.  When eating at a restaurant, ask that your food be prepared with less salt, or no salt if possible. What foods can I eat? Seek help from a dietitian for individual calorie needs. Grains  Whole grain or whole wheat bread. Brown rice. Whole grain or whole wheat pasta. Quinoa, bulgur, and whole grain cereals. Low-sodium cereals. Corn or whole wheat flour tortillas. Whole grain cornbread. Whole grain crackers. Low-sodium crackers. Vegetables  Fresh or frozen vegetables (raw, steamed, roasted, or grilled). Low-sodium or reduced-sodium tomato and vegetable juices. Low-sodium or reduced-sodium tomato sauce and paste. Low-sodium or reduced-sodium canned vegetables. Fruits  All fresh, canned (in natural juice), or frozen fruits. Meat and Other Protein Products  Ground beef (85% or leaner), grass-fed beef, or beef trimmed of fat. Skinless chicken or [place]. Ground chicken or [place]. Pork trimmed of fat. All fish and seafood. Eggs. Dried beans, peas, or lentils. Unsalted nuts and seeds. Unsalted canned beans. Dairy  Low-fat dairy products, such as skim or 1% milk, 2% or reduced-fat cheeses, low-fat  ricotta or cottage cheese, or plain low-fat yogurt. Low-sodium or reduced-sodium cheeses. Fats and Oils  Tub margarines without trans fats. Light or reduced-fat mayonnaise and salad dressings (reduced sodium). Avocado. Safflower, olive, or canola oils. Natural peanut or almond butter. Other  Unsalted popcorn and pretzels. The items listed above may not be a  complete list of recommended foods or beverages. Contact your dietitian for more options.  What foods are not recommended? Grains  White bread. White pasta. White rice. Refined cornbread. Bagels and croissants. Crackers that contain trans fat. Vegetables  Creamed or fried vegetables. Vegetables in a cheese sauce. Regular canned vegetables. Regular canned tomato sauce and paste. Regular tomato and vegetable juices. Fruits  Canned fruit in light or heavy syrup. Fruit juice. Meat and Other Protein Products  Fatty cuts of meat. Ribs, chicken wings, bacon, sausage, bologna, salami, chitterlings, fatback, hot dogs, bratwurst, and packaged luncheon meats. Salted nuts and seeds. Canned beans with salt. Dairy  Whole or 2% milk, cream, half-and-half, and cream cheese. Whole-fat or sweetened yogurt. Full-fat cheeses or blue cheese. Nondairy creamers and whipped toppings. Processed cheese, cheese spreads, or cheese curds. Condiments  Onion and garlic salt, seasoned salt, table salt, and sea salt. Canned and packaged gravies. Worcestershire sauce. Tartar sauce. Barbecue sauce. Teriyaki sauce. Soy sauce, including reduced sodium. Steak sauce. Fish sauce. Oyster sauce. Cocktail sauce. Horseradish. Ketchup and mustard. Meat flavorings and tenderizers. Bouillon cubes. Hot sauce. Tabasco sauce. Marinades. Taco seasonings. Relishes. Fats and Oils  Butter, stick margarine, lard, shortening, ghee, and bacon fat. Coconut, palm kernel, or palm oils. Regular salad dressings. Other  Pickles and olives. Salted popcorn and pretzels. The items listed above may not be a complete list of foods and beverages to avoid. Contact your dietitian for more information.  Where can I find more information? National Heart, Lung, and Blood Institute: travelstabloid.com This information is not intended to replace advice given to you by your health care provider. Make sure you discuss any questions you  have with your health care provider. Document Released: 08/09/2011 Document Revised: 01/26/2016 Document Reviewed: 06/24/2013 Elsevier Interactive Patient Education  2017 [employer].

## 2016-10-02 NOTE — Assessment & Plan Note (Signed)
Working on quitting- nicorette. Quit for a year in his 19s. Good on work days down to 3. Weekends are hard.  Used to be 1 PPD but now down to 0.5 PPD max.   wellbutrin didn't help. Felt weird.   chantix never done. Worried abotu psych side effects.   Advised add in 7mg  patch and cut gum from 4mg  to 2mg . Follow up CPE- proud of his progress

## 2016-10-02 NOTE — Assessment & Plan Note (Signed)
S: poorly controlled on amlodipine 10mg  and losartan-hctz 100-25mg . Was also on atenolol but ran out at pharmacy. Started at age 46. Had a stress test and EKG about 6 months ago but also had stress test when he was younger. Renal ultrasound and echocardiogram in past apparently but not sure how we could get those records  At home has been seeing about 150/90. Lowest has seen 140/80.  BP Readings from Last 3 Encounters:  10/02/16 (!) 168/104  01/11/13 (!) 154/103  01/09/13 (!) 191/129  A/P:Continue current meds:  But add in bystolic 5mg . He will update me by mychart in coming weeks and may titrate to 10mg  potentially. I wonder if do not get records on resistant HTN workup if we will need to complete this at follow up. Would not pursue this if able to get home BP <140/90 and confirm home cuff in office

## 2016-10-02 NOTE — Progress Notes (Signed)
Phone: [phone]  Subjective:  Patient presents today to establish care.  Prior patient of [facility] in [place]. Chief complaint-noted.   See problem oriented charting  The following were reviewed and entered/updated in epic: Past Medical History:  Diagnosis Date  . Acne vulgaris   . Diabetes mellitus   . Hyperlipidemia   . Hypertension    Patient Active Problem List   Diagnosis Date Noted  . DIABETES MELLITUS, TYPE II 05/14/2008  . SEBACEOUS CYST 11/28/2007  . HYPERLIPIDEMIA 06/17/2007  . CONJUNCTIVITIS NOS 04/29/2007  . HYPERTENSION 04/29/2007  . CHICKENPOX, HX OF 04/29/2007   Past Surgical History:  Procedure Laterality Date  . PILONIDAL CYST EXCISION     removal    Family History  Problem Relation Age of Onset  . Alcohol abuse    . Diabetes    . Hyperlipidemia    . Hypertension    . Stroke      Medications- reviewed and updated Current Outpatient Prescriptions  Medication Sig Dispense Refill  . amLODipine (NORVASC) 5 MG tablet Take 1 tablet (5 mg total) by mouth daily. 30 tablet 1  . ciclopirox (LOPROX) 0.77 % cream Apply 1 application topically 2 (two) times daily.    Marland Kitchen losartan-hydrochlorothiazide (HYZAAR) 100-25 MG per tablet Take 1 tablet by mouth daily. 30 tablet 1   No current facility-administered medications for this visit.     Allergies-reviewed and updated No Known Allergies  Social History   Social History  . Marital status: Married    Spouse name: N/A  . Number of children: N/A  . Years of education: N/A   Social History Main Topics  . Smoking status: Current Every Day Smoker    Packs/day: 1.00    Types: Cigarettes  . Smokeless tobacco: Never Used  . Alcohol use Yes     Comment: socially  . Drug use: Yes    Types: Marijuana  . Sexual activity: Not Asked   Other Topics Concern  . None   Social History Narrative  . None    ROS--Full ROS was completed Review of Systems  Constitutional: Negative for chills and  fever.  HENT: Negative for hearing loss.   Eyes: Negative for blurred vision and double vision.  Respiratory: Negative for cough and shortness of breath.   Cardiovascular: Negative for chest pain and palpitations.  Gastrointestinal: Negative for heartburn and nausea.  Genitourinary: Negative for dysuria and urgency.  Musculoskeletal: Negative for myalgias and neck pain.  Skin: Positive for itching and rash (as noted also has acne and cysts on back).  Neurological: Negative for dizziness and headaches.  Endo/Heme/Allergies: Negative for polydipsia. Does not bruise/bleed easily.  Psychiatric/Behavioral: Negative for hallucinations and substance abuse.   Objective: BP (!) 168/104 (BP Location: Left Arm, Patient Position: Sitting, Cuff Size: Large)   Pulse (!) 101   Temp 98.1 F (36.7 C) (Oral)   Ht 6\' 6"  (1.981 m)   Wt 289 lb 6.4 oz (131.3 kg)   SpO2 97%   BMI 33.44 kg/m  Gen: NAD, resting comfortably HEENT: Mucous membranes are moist. Oropharynx normal. TM normal. Eyes: sclera and lids normal, PERRLA Neck: no thyromegaly, no cervical lymphadenopathy CV: RRR no murmurs rubs or gallops Lungs: CTAB no crackles, wheeze, rhonchi Abdomen: soft/nontender/nondistended/normal bowel sounds. No rebound or guarding.  Ext: no edema Skin: warm, dry, multiple slightly raised hypopigmented patches Neuro: 5/5 strength in upper and lower extremities, normal gait, normal reflexes  Assessment/Plan:  Tinea versicolor S: seen by prior PCP and Ciclopirox worked  well for hypopigmented rash with raised areas thought to be fungal. Resolved but then ran out of medicine and recurred.  A/P: retreat with ciclopirox. Consider fluconazole 300mg  weekly for 2 weeks if fails this. Is in fair distribution upper body so topical BID treatment may be tough.   Excess weight Also working on weight with goal exercise 5x a week, 3x a week currently- walks 3-5 miles.  needs to cut down on food portions and has been doing  some. Food free at behavioral health- 25 lbs since July has been put on and needs to reverse  Diabetes mellitus type II, controlled ([place]) S: reports history of high blood sugar years ago but then he lost some weight and started exercising and became controlled. Noted in problem listed idabetes from 05/14/2008- I am able to see note from that day but no a1c noted and unclear what diagnosis was based on Lab Results  Component Value Date   HGBA1C 6.0 07/16/2008  A/P: I do not have records of any elevated a1c. Will get updated a1c. I would consider changing this code to hyperglycemia depending on findgs.   Essential hypertension S: poorly controlled on amlodipine 10mg  and losartan-hctz 100-25mg . Was also on atenolol but ran out at pharmacy. Started at age 46. Had a stress test and EKG about 6 months ago but also had stress test when he was younger. Renal ultrasound and echocardiogram in past apparently but not sure how we could get those records. Asymptomatic from high BP  At home has been seeing about 150/90. Lowest has seen 140/80.  BP Readings from Last 3 Encounters:  10/02/16 (!) 168/104  01/11/13 (!) 154/103  01/09/13 (!) 191/129  A/P:Continue current meds:  But add in bystolic 5mg . He will update me by mychart in coming weeks and may titrate to 10mg  potentially. I wonder if do not get records on resistant HTN workup if we will need to complete this at follow up. Would not pursue this if able to get home BP <140/90 and confirm home cuff in office. Also increase exercise and add dash eating plan  Smoker Working on quitting- nicorette. Quit for a year in his 42s. Good on work days down to 3. Weekends are hard.  Used to be 1 PPD but now down to 0.5 PPD max.   wellbutrin didn't help. Felt weird.   chantix never done. Worried abotu psych side effects.   Advised add in 7mg  patch and cut gum from 4mg  to 2mg . Follow up CPE- proud of his progress  CPE 2-3 months. BP update 2-3 weeks by mychart.  Labs before CPE to include a1c Get records from [facility]  Meds ordered this encounter  Medications  . ciclopirox (LOPROX) 0.77 % cream    Sig: Apply 1 application topically 2 (two) times daily.    Return precautions advised.  Garret Reddish, MD

## 2016-11-21 ENCOUNTER — Other Ambulatory Visit: Payer: 59

## 2016-11-22 MED FILL — BYSTOLIC 5 MG TABLET: 5 | 30 days supply | Qty: 30 | Fill #1

## 2016-11-23 ENCOUNTER — Other Ambulatory Visit: Payer: Self-pay

## 2016-11-23 MED ORDER — AMLODIPINE BESYLATE 5 MG PO TABS: 5.0000 mg | ORAL_TABLET | Freq: Every day | ORAL | 1 refills | Status: DC

## 2016-11-23 MED ORDER — LOSARTAN POTASSIUM-HCTZ 100-25 MG PO TABS: 1.0000 | ORAL_TABLET | Freq: Every day | ORAL | 1 refills | Status: DC

## 2016-11-23 MED FILL — AMLODIPINE BESYLATE 5 MG TA: 5 | 90 days supply | Qty: 90 | Fill #0

## 2016-11-23 MED FILL — LOSARTAN-HCTZ 100-25 MG TAB: 100-25 | 90 days supply | Qty: 90 | Fill #0

## 2016-11-28 ENCOUNTER — Encounter: Payer: 59 | Admitting: Family Medicine

## 2016-12-17 ENCOUNTER — Other Ambulatory Visit (INDEPENDENT_AMBULATORY_CARE_PROVIDER_SITE_OTHER): Payer: 59

## 2016-12-17 ENCOUNTER — Other Ambulatory Visit: Payer: Self-pay

## 2016-12-17 DIAGNOSIS — I10 Essential (primary) hypertension: Secondary | ICD-10-CM

## 2016-12-17 DIAGNOSIS — Z114 Encounter for screening for human immunodeficiency virus [HIV]: Secondary | ICD-10-CM | POA: Diagnosis not present

## 2016-12-17 DIAGNOSIS — E119 Type 2 diabetes mellitus without complications: Secondary | ICD-10-CM

## 2016-12-17 LAB — CBC WITH DIFFERENTIAL/PLATELET
BASOS PCT: 2.9 % (ref 0.0–3.0)
Basophils Absolute: 0.2 10*3/uL — ABNORMAL HIGH (ref 0.0–0.1)
Eosinophils Absolute: 0.1 10*3/uL (ref 0.0–0.7)
Eosinophils Relative: 1.7 % (ref 0.0–5.0)
HEMATOCRIT: 42.9 % (ref 39.0–52.0)
Hemoglobin: 14.8 g/dL (ref 13.0–17.0)
LYMPHS PCT: 42.4 % (ref 12.0–46.0)
Lymphs Abs: 3.3 10*3/uL (ref 0.7–4.0)
MCHC: 34.5 g/dL (ref 30.0–36.0)
MCV: 89.5 fl (ref 78.0–100.0)
MONOS PCT: 8 % (ref 3.0–12.0)
Monocytes Absolute: 0.6 10*3/uL (ref 0.1–1.0)
NEUTROS ABS: 3.5 10*3/uL (ref 1.4–7.7)
Neutrophils Relative %: 45 % (ref 43.0–77.0)
Platelets: 264 10*3/uL (ref 150.0–400.0)
RBC: 4.79 Mil/uL (ref 4.22–5.81)
RDW: 13.4 % (ref 11.5–15.5)
WBC: 7.7 10*3/uL (ref 4.0–10.5)

## 2016-12-17 LAB — COMPREHENSIVE METABOLIC PANEL
ALT: 22 U/L (ref 0–53)
AST: 21 U/L (ref 0–37)
Albumin: 4.3 g/dL (ref 3.5–5.2)
Alkaline Phosphatase: 83 U/L (ref 39–117)
BILIRUBIN TOTAL: 0.8 mg/dL (ref 0.2–1.2)
BUN: 18 mg/dL (ref 6–23)
CALCIUM: 9.9 mg/dL (ref 8.4–10.5)
CHLORIDE: 104 meq/L (ref 96–112)
CO2: 29 meq/L (ref 19–32)
Creatinine, Ser: 1.1 mg/dL (ref 0.40–1.50)
GFR: 91.98 mL/min (ref >60.00)
Glucose, Bld: 134 mg/dL — ABNORMAL HIGH (ref 70–99)
Potassium: 4.3 mEq/L (ref 3.5–5.1)
Sodium: 139 mEq/L (ref 135–145)
Total Protein: 7.2 g/dL (ref 6.0–8.3)

## 2016-12-17 LAB — LIPID PANEL
Cholesterol: 221 mg/dL — ABNORMAL HIGH (ref 0–200)
HDL: 41.3 mg/dL (ref >39.00)
NONHDL: 179.66
TRIGLYCERIDES: 294 mg/dL — AB (ref 0.0–149.0)
Total CHOL/HDL Ratio: 5
VLDL: 58.8 mg/dL — ABNORMAL HIGH (ref 0.0–40.0)

## 2016-12-17 LAB — LDL CHOLESTEROL, DIRECT: Direct LDL: 125 mg/dL

## 2016-12-17 LAB — HEMOGLOBIN A1C: Hgb A1c MFr Bld: 6.7 % — ABNORMAL HIGH (ref 4.6–6.5)

## 2016-12-18 LAB — HIV ANTIBODY (ROUTINE TESTING W REFLEX): HIV 1&2 Ab, 4th Generation: NONREACTIVE

## 2016-12-21 ENCOUNTER — Ambulatory Visit (INDEPENDENT_AMBULATORY_CARE_PROVIDER_SITE_OTHER): Payer: 59 | Admitting: Family Medicine

## 2016-12-21 ENCOUNTER — Encounter: Payer: Self-pay | Admitting: Family Medicine

## 2016-12-21 VITALS — BP 156/88 | HR 84 | Temp 98.7°F | Ht 78.0 in | Wt 290.2 lb

## 2016-12-21 DIAGNOSIS — I10 Essential (primary) hypertension: Secondary | ICD-10-CM

## 2016-12-21 DIAGNOSIS — Z0000 Encounter for general adult medical examination without abnormal findings: Secondary | ICD-10-CM | POA: Diagnosis not present

## 2016-12-21 DIAGNOSIS — E119 Type 2 diabetes mellitus without complications: Secondary | ICD-10-CM

## 2016-12-21 DIAGNOSIS — E7800 Pure hypercholesterolemia, unspecified: Secondary | ICD-10-CM | POA: Diagnosis not present

## 2016-12-21 MED ORDER — NEBIVOLOL HCL 10 MG PO TABS: 10.0000 mg | ORAL_TABLET | Freq: Every day | ORAL | 3 refills | Status: DC

## 2016-12-21 MED ORDER — AMLODIPINE BESYLATE 10 MG PO TABS: 10.0000 mg | ORAL_TABLET | Freq: Every day | ORAL | 3 refills | Status: DC

## 2016-12-21 NOTE — Progress Notes (Signed)
Phone: [phone]  Subjective:  Patient presents today for their annual physical. Chief complaint-noted.   See problem oriented charting- ROS- full  review of systems was completed and negative except for: right shoulder pain. No chest pain or shortness of breath. No headache or blurry vision (some far sightedness)  The following were reviewed and entered/updated in epic: Past Medical History:  Diagnosis Date  . Acne vulgaris   . Chicken pox   . Diabetes mellitus   . Hyperlipidemia   . Hypertension    Patient Active Problem List   Diagnosis Date Noted  . Smoker 10/02/2016    Priority: High  . Diabetes mellitus type II, controlled ([place]) 05/14/2008    Priority: High  . Hyperlipidemia 06/17/2007    Priority: Medium  . Essential hypertension 04/29/2007    Priority: Medium  . Sebaceous cyst 11/28/2007    Priority: Low   Past Surgical History:  Procedure Laterality Date  . PILONIDAL CYST EXCISION     removal    Family History  Problem Relation Age of Onset  . Hyperlipidemia    . Aneurysm Mother     brain. passed 2003. complained of HA one day and passed next day  . Diabetes Mother   . Other Father     does not talk much- not a good relationship. unknown  . CVA Maternal Grandmother     x5  . Alcohol abuse Maternal Grandmother   . Hypertension Maternal Grandfather     Medications- reviewed and updated Current Outpatient Prescriptions  Medication Sig Dispense Refill  . amLODipine (NORVASC) 10 MG tablet Take 1 tablet (10 mg total) by mouth daily. 90 tablet 3  . aspirin EC 81 MG tablet Take 81 mg by mouth daily.    . ciclopirox (LOPROX) 0.77 % cream Apply 1 application topically 2 (two) times daily. 90 g 1  . losartan-hydrochlorothiazide (HYZAAR) 100-25 MG tablet Take 1 tablet by mouth daily. 90 tablet 1  . nebivolol (BYSTOLIC) 10 MG tablet Take 1 tablet (10 mg total) by mouth daily. 90 tablet 3   No current facility-administered medications for this visit.      Allergies-reviewed and updated No Known Allergies  Social History   Social History  . Marital status: Married    Spouse name: N/A  . Number of children: N/A  . Years of education: N/A   Social History Main Topics  . Smoking status: Current Every Day Smoker    Packs/day: 0.50    Types: Cigarettes  . Smokeless tobacco: Never Used  . Alcohol use 0.6 - 1.2 oz/week    1 - 2 Glasses of wine per week  . Drug use: Yes    Types: Marijuana     Comment: sparing  . Sexual activity: Not Asked   Other Topics Concern  . None   Social History Narrative   Married 2000. Step daughter and biological daughter. 23 and 17 in 2018- HS senior [employer].    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    College at [employer] finished.       Hobbies: travel- [place]    Objective: BP (!) 156/88 (BP Location: Left Arm, Patient Position: Sitting, Cuff Size: Large)   Pulse 84   Temp 98.7 F (37.1 C) (Oral)   Ht 6\' 6"  (1.981 m)   Wt 290 lb 3.2 oz (131.6 kg)   SpO2 97%   BMI 33.54 kg/m  Gen: NAD, resting comfortably  HEENT: Mucous membranes are moist. Oropharynx normal Neck: no thyromegaly CV: RRR no murmurs rubs or gallops Lungs: CTAB no crackles, wheeze, rhonchi Abdomen: soft/nontender/nondistended/normal bowel sounds. No rebound or guarding. obese Ext: no edema Skin: warm, dry Neuro: grossly normal, moves all extremities, PERRLA  Declines rectal this year- agrees to next year Diabetic Foot Exam - Simple   Simple Foot Form Diabetic Foot exam was performed with the following findings:  Yes 12/21/2016  3:18 PM  Visual Inspection No deformities, no ulcerations, no other skin breakdown bilaterally:  Yes Sensation Testing Intact to touch and monofilament testing bilaterally:  Yes Pulse Check Posterior Tibialis and Dorsalis pulse intact bilaterally:  Yes Comments      Assessment/Plan:  46 y.o. male presenting for annual  physical.  Health Maintenance counseling: 1. Anticipatory guidance: Patient counseled regarding regular dental exams q71months, eye exams - will start this, wearing seatbelts.  2. Risk factor reduction:  Advised patient of need for regular exercise and diet rich and fruits and vegetables to reduce risk of heart attack and stroke. Exercise- walking an hour 2 x a week- advised increase. Diet-refer to nutrition to help.  Wt Readings from Last 3 Encounters:  12/21/16 290 lb 3.2 oz (131.6 kg)  10/02/16 289 lb 6.4 oz (131.3 kg)  01/09/13 284 lb (128.8 kg)   3. Immunizations/screenings/ancillary studies Immunization History  Administered Date(s) Administered  . Influenza-Unspecified 06/20/2016   Health Maintenance Due  Topic Date Due  . PNEUMOCOCCAL POLYSACCHARIDE VACCINE (1)- today 03/14/1971  . FOOT EXAM - normal today 03/14/1979  . OPHTHALMOLOGY EXAM - refer today 03/14/1979  . TETANUS/TDAP - had this with work - will get records 03/13/1988  4. Prostate cancer screening- no family history prostate cancer. Will start psa/rectal exams next year . No family history. Declines for now 5. Colon cancer screening - no family history, start at age 54.  28. Skin cancer screening/prevention- high melanin content- lower risk. No areas of concern per patient.  7. Testicular cancer screening- advised monthly self exams - already doing 8. STD screening- patient opts out.   Status of chronic or acute concerns   Smoker- advised cessation. 1/4 ppd on work days. 1/2 ppd on days off. Encouraged cessation  Start asa 81mg   Tinea versicolor- not improving- using once a day only- will increase to twice a day  Right shoulder pain- advised home exercises to strengthen rotator cuff. May need sports medicine referral. Avoiding nsaids due to dm/smoking/htn.   Essential hypertension S: controlled poorly in office on amlodipine 5mg , losartan-hctz 100-25mg , bystolic 5mg . Home cuff verified today. Home cuff also with  readings above 140/90 BP Readings from Last 3 Encounters:  12/21/16 (!) 156/88  10/02/16 (!) 168/104  01/11/13 (!) 154/103  A/P:Continue current meds:  But increase amlodipine to 10mg  and bystolic to 10mg  with follow up 4-6 weeks. Keep checking at home  Hyperlipidemia HLD- discussed statin with LDL over 100 in diabetic. Taken off statin due to edema reportedly.  . As increasing amlodipine will wait to restart statin for now given prior edema issue  Diabetes mellitus type II, controlled ([place]) DM- controlled without medication. Wants to work on lifestyle change and stay off meds.  Lab Results  Component Value Date   HGBA1C 6.7 (H) 12/17/2016  walking outside of work about an hour- probably 1-2 x a week. Discussed great consistency. Wants to improve diet- refer to diabetes education   No Follow-up on file.  Orders Placed This Encounter  Procedures  . Amb Referral to  Nutrition and Diabetic E    Referral Priority:   Routine    Referral Type:   Consultation    Referral Reason:   Specialty Services Required    Number of Visits Requested:   1  . Ambulatory referral to Ophthalmology    Referral Priority:   Routine    Referral Type:   Consultation    Referral Reason:   Specialty Services Required    Requested Specialty:   Ophthalmology    Number of Visits Requested:   1    Meds ordered this encounter  Medications  . amLODipine (NORVASC) 10 MG tablet    Sig: Take 1 tablet (10 mg total) by mouth daily.    Dispense:  90 tablet    Refill:  3  . nebivolol (BYSTOLIC) 10 MG tablet    Sig: Take 1 tablet (10 mg total) by mouth daily.    Dispense:  90 tablet    Refill:  3  . aspirin EC 81 MG tablet    Sig: Take 81 mg by mouth daily.    Return precautions advised.   Garret Reddish, MD

## 2016-12-21 NOTE — Progress Notes (Signed)
Pre visit review using our clinic review tool, if applicable. No additional management support is needed unless otherwise documented below in the visit note. 

## 2016-12-21 NOTE — Assessment & Plan Note (Signed)
HLD- discussed statin with LDL over 100 in diabetic. Taken off statin due to edema reportedly.  . As increasing amlodipine will wait to restart statin for now given prior edema issue

## 2016-12-21 NOTE — Patient Instructions (Addendum)
increase amlodipine to 10mg  and bystolic to 10mg  with follow up 4-6 weeks. Keep checking at home. Bring cuff with you  We will call you within a week or two about your referral to diabetes education and eye doctor. If you do not hear within 3 weeks, give [place] a call.   Start aspirin 81 mg everyday  Pneumovax 23 today  Talk to HR and get records from last tetanus shot- can upload this to [employer]

## 2016-12-21 NOTE — Assessment & Plan Note (Signed)
S: controlled poorly in office on amlodipine 5mg , losartan-hctz 100-25mg , bystolic 5mg . Home cuff verified today. Home cuff also with readings above 140/90 BP Readings from Last 3 Encounters:  12/21/16 (!) 156/88  10/02/16 (!) 168/104  01/11/13 (!) 154/103  A/P:Continue current meds:  But increase amlodipine to 10mg  and bystolic to 10mg  with follow up 4-6 weeks. Keep checking at home

## 2016-12-21 NOTE — Assessment & Plan Note (Signed)
DM- controlled without medication. Wants to work on lifestyle change and stay off meds.  Lab Results  Component Value Date   HGBA1C 6.7 (H) 12/17/2016  walking outside of work about an hour- probably 1-2 x a week. Discussed great consistency. Wants to improve diet- refer to diabetes education

## 2017-01-02 MED FILL — AMLODIPINE BESYLATE 10 MG T: 10 | 90 days supply | Qty: 90 | Fill #0

## 2017-01-02 MED FILL — BYSTOLIC 10 MG TABLET: 10 | 30 days supply | Qty: 30 | Fill #0

## 2017-01-11 MED FILL — CICLOPIROX 0.77% CREAM: 0.77 | 21 days supply | Qty: 90 | Fill #1

## 2017-02-01 ENCOUNTER — Ambulatory Visit: Payer: 59 | Admitting: Family Medicine

## 2017-02-01 MED FILL — BYSTOLIC 10 MG TABLET: 10 | 30 days supply | Qty: 30 | Fill #1

## 2017-02-25 ENCOUNTER — Ambulatory Visit: Payer: 59 | Admitting: Family Medicine

## 2017-03-05 MED FILL — LOSARTAN-HCTZ 100-25 MG TAB: 100-25 | 90 days supply | Qty: 90 | Fill #1

## 2017-03-05 MED FILL — BYSTOLIC 10 MG TABLET: 10 | 30 days supply | Qty: 30 | Fill #2

## 2017-03-20 ENCOUNTER — Encounter: Payer: Self-pay | Admitting: Family Medicine

## 2017-03-20 ENCOUNTER — Ambulatory Visit (INDEPENDENT_AMBULATORY_CARE_PROVIDER_SITE_OTHER): Payer: 59 | Admitting: Family Medicine

## 2017-03-20 VITALS — BP 154/104 | HR 80 | Temp 98.2°F | Ht 78.0 in | Wt 284.0 lb

## 2017-03-20 DIAGNOSIS — I10 Essential (primary) hypertension: Secondary | ICD-10-CM | POA: Diagnosis not present

## 2017-03-20 DIAGNOSIS — Z125 Encounter for screening for malignant neoplasm of prostate: Secondary | ICD-10-CM | POA: Diagnosis not present

## 2017-03-20 DIAGNOSIS — Z23 Encounter for immunization: Secondary | ICD-10-CM | POA: Diagnosis not present

## 2017-03-20 DIAGNOSIS — E119 Type 2 diabetes mellitus without complications: Secondary | ICD-10-CM

## 2017-03-20 DIAGNOSIS — E785 Hyperlipidemia, unspecified: Secondary | ICD-10-CM

## 2017-03-20 DIAGNOSIS — B360 Pityriasis versicolor: Secondary | ICD-10-CM | POA: Insufficient documentation

## 2017-03-20 DIAGNOSIS — F17200 Nicotine dependence, unspecified, uncomplicated: Secondary | ICD-10-CM

## 2017-03-20 NOTE — Assessment & Plan Note (Signed)
Taken off statin due to edema reportedly.  Did not develop edema on amlodipine but will hold off on trial of statin until BP at goal as we are adjusting meds there.

## 2017-03-20 NOTE — Patient Instructions (Addendum)
Tdap today  We will call you within a week or two about your referral for renal artery stenosis testing. If you do not hear within 3 weeks, give [place] a call.   Schedule a lab visit at the check out desk within 2 weeks. Return for future fasting labs meaning nothing but water after midnight please. Ok to take your medications with water.   I am going to add more labs but I need more time to think through these.

## 2017-03-20 NOTE — Assessment & Plan Note (Signed)
S: well controlled. On no rx Lab Results  Component Value Date   HGBA1C 6.7 (H) 12/17/2016   HGBA1C 6.0 07/16/2008   HGBA1C 6.3 (H) 12/03/2007   A/P: update a1c with labs

## 2017-03-20 NOTE — Assessment & Plan Note (Signed)
Resistant hypertension S: Patient informs me that he has had hypertension since age 46 and was held out of basketball at times. On meds since 20- has never really been controlled. Thinks possible he may have had renal artery evaluation but not sure. Also thinks he had an echo that showed either the heart or a valve was "enlarged"  Today, poorly controlled on amlodipine 10mg  (recent increase), losartan hct 100-25mg , bystolic 10mg  (recent increase). Home cuff verified- slightly higher. Home readings have been 160-105.  ASCVD 10 year risk calculation if age 32-79: reports edema on statin and was taken off BP Readings from Last 3 Encounters:  03/20/17 (!) 154/104  12/21/16 (!) 156/88  10/02/16 (!) 168/104  A/P: We discussed blood pressure goal of <140/90.  Now on 4 max dose medications. Will embark on resistant hypertension workup as noted below. Not episodic- strongly doubt pheochromocytoma and now workup started. Spoke with radiology who suggested CT angiography for eval renal artery stenosis due to obesity. I told patient I thought most likely next drug choice would be spironolactone 12.5mg  after workup.   Uptodate also mentions consideration of 24 hour urine for sodium excertion, creatinine clearance and aldosterone excretion but nto sure if that would change plan. He could improve diet but doubt this level of hypertension is solely due to salt.

## 2017-03-20 NOTE — Progress Notes (Signed)
Subjective:  Carlos Patterson is a 46 y.o. year old very pleasant male patient who presents for/with See problem oriented charting ROS- No chest pain or shortness of breath. No headache or blurry vision.    Past Medical History-  Patient Active Problem List   Diagnosis Date Noted  . Smoker 10/02/2016    Priority: High  . Diabetes mellitus type II, controlled ([place]) 05/14/2008    Priority: High  . Hyperlipidemia 06/17/2007    Priority: Medium  . Resistant hypertension 04/29/2007    Priority: Medium  . Sebaceous cyst 11/28/2007    Priority: Low  . Tinea versicolor 03/20/2017    Medications- reviewed and updated Current Outpatient Prescriptions  Medication Sig Dispense Refill  . amLODipine (NORVASC) 10 MG tablet Take 1 tablet (10 mg total) by mouth daily. 90 tablet 3  . aspirin EC 81 MG tablet Take 81 mg by mouth daily.    . ciclopirox (LOPROX) 0.77 % cream Apply 1 application topically 2 (two) times daily. 90 g 1  . losartan-hydrochlorothiazide (HYZAAR) 100-25 MG tablet Take 1 tablet by mouth daily. 90 tablet 1  . nebivolol (BYSTOLIC) 10 MG tablet Take 1 tablet (10 mg total) by mouth daily. 90 tablet 3   No current facility-administered medications for this visit.     Objective: BP (!) 154/104 (BP Location: Left Arm, Patient Position: Sitting, Cuff Size: Large)   Pulse 80   Temp 98.2 F (36.8 C) (Oral)   Ht 6\' 6"  (1.981 m)   Wt 284 lb (128.8 kg)   SpO2 98%   BMI 32.82 kg/m  Gen: NAD, resting comfortably CV: RRR no murmurs rubs or gallops Lungs: CTAB no crackles, wheeze, rhonchi Abdomen: soft/nontender/nondistended/normal bowel sounds. No rebound or guarding. Overweight, tall Ext: no edema Skin: warm, dry  Assessment/Plan:  Smoker 1/4 PPD work days but working more. 1/2 PPD days off. Encouraged complete cessation- he states will try to work to cut down.   Diabetes mellitus type II, controlled ([place]) S: well controlled. On no rx Lab Results  Component Value Date   HGBA1C 6.7 (H) 12/17/2016   HGBA1C 6.0 07/16/2008   HGBA1C 6.3 (H) 12/03/2007   A/P: update a1c with labs   Resistant hypertension Resistant hypertension S: Patient informs me that he has had hypertension since age 38 and was held out of basketball at times. On meds since 20- has never really been controlled. Thinks possible he may have had renal artery evaluation but not sure. Also thinks he had an echo that showed either the heart or a valve was "enlarged"  Today, poorly controlled on amlodipine 10mg  (recent increase), losartan hct 100-25mg , bystolic 10mg  (recent increase). Home cuff verified- slightly higher. Home readings have been 160-105.  ASCVD 10 year risk calculation if age 58-79: reports edema on statin and was taken off BP Readings from Last 3 Encounters:  03/20/17 (!) 154/104  12/21/16 (!) 156/88  10/02/16 (!) 168/104  A/P: We discussed blood pressure goal of <140/90.  Now on 4 max dose medications. Will embark on resistant hypertension workup as noted below. Not episodic- strongly doubt pheochromocytoma and now workup started. Spoke with radiology who suggested CT angiography for eval renal artery stenosis due to obesity. I told patient I thought most likely next drug choice would be spironolactone 12.5mg  after workup.   Uptodate also mentions consideration of 24 hour urine for sodium excertion, creatinine clearance and aldosterone excretion but nto sure if that would change plan. He could improve diet but doubt this level  of hypertension is solely due to salt.    Hyperlipidemia Taken off statin due to edema reportedly.  Did not develop edema on amlodipine but will hold off on trial of statin until BP at goal as we are adjusting meds there.   Need to check back in after evaluation and BP medication changes- advised 3 months at latest.   Orders Placed This Encounter  Procedures  . CT ANGIO ABDOMEN W &/OR WO CONTRAST    Standing Status:   Future    Standing Expiration Date:    06/20/2018    Order Specific Question:   If indicated for the ordered procedure, I authorize the administration of contrast media per Radiology protocol    Answer:   Yes    Order Specific Question:   Reason for Exam (SYMPTOM  OR DIAGNOSIS REQUIRED)    Answer:   resistant hypertension. Evaluate renal artery- if this needs to be changed to ct abdomen/pelvis angio I am ok with order change. Also if cannot be done at [place] can be any location.    Order Specific Question:   Preferred imaging location?    Answer:   [facility]    Order Specific Question:   Radiology Contrast Protocol - do NOT remove file path    Answer:   [email or web address]  . Tdap vaccine greater than or equal to 7yo IM  . Basic metabolic panel    Standing Status:   Future    Standing Expiration Date:   03/20/2018  . PSA    Standing Status:   Future    Standing Expiration Date:   03/20/2018  . Microalbumin / creatinine urine ratio    [place]    Standing Status:   Future    Standing Expiration Date:   03/20/2018  . Aldosterone + renin activity w/ ratio    Standing Status:   Future    Standing Expiration Date:   03/20/2018  . Hemoglobin A1c    [place]    Standing Status:   Future    Standing Expiration Date:   03/20/2018  . POCT Urinalysis Dipstick (Automated)    Standing Status:   Future    Standing Expiration Date:   04/20/2017   Return precautions advised.  Garret Reddish, MD

## 2017-03-20 NOTE — Assessment & Plan Note (Signed)
1/4 PPD work days but working more. 1/2 PPD days off. Encouraged complete cessation- he states will try to work to cut down.

## 2017-03-22 ENCOUNTER — Inpatient Hospital Stay: Admission: RE | Admit: 2017-03-22 | Payer: 59 | Source: Ambulatory Visit

## 2017-03-29 DIAGNOSIS — E119 Type 2 diabetes mellitus without complications: Secondary | ICD-10-CM | POA: Diagnosis not present

## 2017-03-29 DIAGNOSIS — H52203 Unspecified astigmatism, bilateral: Secondary | ICD-10-CM | POA: Diagnosis not present

## 2017-03-29 LAB — HM DIABETES EYE EXAM

## 2017-04-04 ENCOUNTER — Other Ambulatory Visit (INDEPENDENT_AMBULATORY_CARE_PROVIDER_SITE_OTHER): Payer: 59

## 2017-04-04 DIAGNOSIS — I10 Essential (primary) hypertension: Secondary | ICD-10-CM

## 2017-04-04 DIAGNOSIS — Z125 Encounter for screening for malignant neoplasm of prostate: Secondary | ICD-10-CM | POA: Diagnosis not present

## 2017-04-04 DIAGNOSIS — E119 Type 2 diabetes mellitus without complications: Secondary | ICD-10-CM | POA: Diagnosis not present

## 2017-04-04 LAB — POC URINALSYSI DIPSTICK (AUTOMATED)
Bilirubin, UA: NEGATIVE
Blood, UA: NEGATIVE
CLARITY UA: NEGATIVE
GLUCOSE UA: NEGATIVE
Ketones, UA: NEGATIVE
LEUKOCYTES UA: NEGATIVE
Nitrite, UA: NEGATIVE
PROTEIN UA: NEGATIVE
Spec Grav, UA: >=1.03 — AB (ref 1.010–1.025)
UROBILINOGEN UA: 0.2 U/dL
pH, UA: 6 (ref 5.0–8.0)

## 2017-04-04 LAB — MICROALBUMIN / CREATININE URINE RATIO
Creatinine,U: 285.5 mg/dL
Microalb Creat Ratio: 0.4 mg/g (ref 0.0–30.0)
Microalb, Ur: 1.1 mg/dL (ref 0.0–1.9)

## 2017-04-04 LAB — BASIC METABOLIC PANEL
BUN: 15 mg/dL (ref 6–23)
CALCIUM: 9.5 mg/dL (ref 8.4–10.5)
CO2: 26 meq/L (ref 19–32)
CREATININE: 0.9 mg/dL (ref 0.40–1.50)
Chloride: 107 mEq/L (ref 96–112)
GFR: 115.8 mL/min (ref >60.00)
GLUCOSE: 127 mg/dL — AB (ref 70–99)
Potassium: 4.1 mEq/L (ref 3.5–5.1)
Sodium: 141 mEq/L (ref 135–145)

## 2017-04-04 LAB — HEMOGLOBIN A1C: Hgb A1c MFr Bld: 6.4 % (ref 4.6–6.5)

## 2017-04-04 LAB — PSA: PSA: 1.67 ng/mL (ref 0.10–4.00)

## 2017-04-05 ENCOUNTER — Encounter: Payer: Self-pay | Admitting: Family Medicine

## 2017-04-05 ENCOUNTER — Ambulatory Visit (INDEPENDENT_AMBULATORY_CARE_PROVIDER_SITE_OTHER)
Admission: RE | Admit: 2017-04-05 | Discharge: 2017-04-05 | Disposition: A | Discharge status: Home / Self Care | Payer: 59 | Source: Ambulatory Visit | Attending: Family Medicine | Admitting: Family Medicine

## 2017-04-05 DIAGNOSIS — I10 Essential (primary) hypertension: Secondary | ICD-10-CM

## 2017-04-05 MED ORDER — IOPAMIDOL (ISOVUE-370) INJECTION 76%
100.0000 mL | Freq: Once | INTRAVENOUS | Status: AC | PRN
  Administered 2017-04-05: 100 mL via INTRAVENOUS

## 2017-04-09 ENCOUNTER — Other Ambulatory Visit: Payer: Self-pay | Admitting: Family Medicine

## 2017-04-09 LAB — ALDOSTERONE + RENIN ACTIVITY W/ RATIO
ALDO / PRA Ratio: 9.7 Ratio (ref 0.9–28.9)
Aldosterone: 10 ng/dL
PRA LC/MS/MS: 1.03 ng/mL/h (ref 0.25–5.82)

## 2017-04-09 MED ORDER — SPIRONOLACTONE 25 MG PO TABS: 12.5000 mg | ORAL_TABLET | Freq: Every day | ORAL | 5 refills | Status: DC

## 2017-04-09 MED FILL — SPIRONOLACTONE 25 MG TABLET: 25 | 30 days supply | Qty: 30 | Fill #0

## 2017-04-09 NOTE — Progress Notes (Signed)
.  mychart

## 2017-04-11 MED FILL — BYSTOLIC 10 MG TABLET: 10 | 30 days supply | Qty: 30 | Fill #3

## 2017-04-16 ENCOUNTER — Other Ambulatory Visit: Payer: Self-pay

## 2017-04-16 DIAGNOSIS — E875 Hyperkalemia: Secondary | ICD-10-CM

## 2017-04-19 ENCOUNTER — Encounter: Payer: Self-pay | Admitting: Family Medicine

## 2017-04-19 ENCOUNTER — Other Ambulatory Visit (INDEPENDENT_AMBULATORY_CARE_PROVIDER_SITE_OTHER): Payer: 59

## 2017-04-19 DIAGNOSIS — E875 Hyperkalemia: Secondary | ICD-10-CM | POA: Diagnosis not present

## 2017-04-19 LAB — BASIC METABOLIC PANEL
BUN: 15 mg/dL (ref 6–23)
CALCIUM: 9.7 mg/dL (ref 8.4–10.5)
CO2: 27 mEq/L (ref 19–32)
Chloride: 103 mEq/L (ref 96–112)
Creatinine, Ser: 0.95 mg/dL (ref 0.40–1.50)
GFR: 108.77 mL/min (ref >60.00)
GLUCOSE: 134 mg/dL — AB (ref 70–99)
Potassium: 3.9 mEq/L (ref 3.5–5.1)
SODIUM: 137 meq/L (ref 135–145)

## 2017-04-23 ENCOUNTER — Other Ambulatory Visit: Payer: Self-pay

## 2017-04-23 DIAGNOSIS — E875 Hyperkalemia: Secondary | ICD-10-CM

## 2017-04-29 ENCOUNTER — Other Ambulatory Visit: Payer: Self-pay

## 2017-04-29 ENCOUNTER — Ambulatory Visit (INDEPENDENT_AMBULATORY_CARE_PROVIDER_SITE_OTHER): Payer: 59 | Admitting: Family Medicine

## 2017-04-29 ENCOUNTER — Encounter: Payer: Self-pay | Admitting: Family Medicine

## 2017-04-29 VITALS — BP 146/98 | HR 76 | Temp 98.5°F | Ht 78.0 in | Wt 287.6 lb

## 2017-04-29 DIAGNOSIS — E875 Hyperkalemia: Secondary | ICD-10-CM | POA: Diagnosis not present

## 2017-04-29 DIAGNOSIS — I10 Essential (primary) hypertension: Secondary | ICD-10-CM

## 2017-04-29 DIAGNOSIS — F17200 Nicotine dependence, unspecified, uncomplicated: Secondary | ICD-10-CM | POA: Diagnosis not present

## 2017-04-29 DIAGNOSIS — Z23 Encounter for immunization: Secondary | ICD-10-CM

## 2017-04-29 DIAGNOSIS — I1A0 Resistant hypertension: Secondary | ICD-10-CM

## 2017-04-29 LAB — BASIC METABOLIC PANEL
BUN: 12 mg/dL (ref 6–23)
CALCIUM: 10.3 mg/dL (ref 8.4–10.5)
CO2: 29 meq/L (ref 19–32)
Chloride: 101 mEq/L (ref 96–112)
Creatinine, Ser: 0.87 mg/dL (ref 0.40–1.50)
GFR: 120.38 mL/min (ref >60.00)
GLUCOSE: 131 mg/dL — AB (ref 70–99)
Potassium: 4.1 mEq/L (ref 3.5–5.1)
Sodium: 135 mEq/L (ref 135–145)

## 2017-04-29 MED ORDER — CHLORTHALIDONE 25 MG PO TABS: 25.0000 mg | ORAL_TABLET | Freq: Every day | ORAL | 5 refills | Status: DC

## 2017-04-29 MED ORDER — LOSARTAN POTASSIUM 100 MG PO TABS: 100.0000 mg | ORAL_TABLET | Freq: Every day | ORAL | 5 refills | Status: DC

## 2017-04-29 NOTE — Assessment & Plan Note (Signed)
S: resistant HTN. Diagnosed age 46. On meds since 20- never controlled per his report. CT angiography without renal artery stenosis. Not episodic so did not test for pheochromocytoma. Aldosterone/renin levels were normal.   controlled poorly on amlodipine 10mg , losartn hct 100-25mg , bystolic 10mg . 2 weeks ago started spironolactone 12.5mg . BP Readings from Last 3 Encounters:  04/29/17 (!) 146/98  03/20/17 (!) 154/104  12/21/16 (!) 156/88  A/P: We discussed blood pressure goal of <140/90. Slightly improved today.   We opted to:  STOP losartan-hctz Start losartan 100mg  alone Start chlorthalidone 25mg  alone Get Bmet today Discussed weight loss and salt restriction may help  Next steps:  1. Lisinopril instead of losartan (tried in the past- does not report SE and states BP better) 2. Trial labetalol instead of bystolic 3. May trial off of spironolactone or beta blocker to see if BP increases. Consider lasix as alternative 4. Clonidine add on

## 2017-04-29 NOTE — Patient Instructions (Addendum)
Continue amlodipine Continue bystolic Continue spironolactone half tablet  STOP losartan-hctz Start losartan 100mg  alone Start chlorthalidone 25mg  alone  Please stop by lab before you go  Follow up in 1 month

## 2017-04-29 NOTE — Assessment & Plan Note (Signed)
Encouraged cessation still working on cutting back slowly. 1/2 ppd on days off. 1/4 PPD when working- discussed effects on BP

## 2017-04-29 NOTE — Progress Notes (Signed)
Subjective:  Carlos Patterson is a 46 y.o. year old very pleasant male patient who presents for/with See problem oriented charting ROS- No chest pain or shortness of breath. No headache or blurry vision.    Past Medical History-  Patient Active Problem List   Diagnosis Date Noted  . Smoker 10/02/2016    Priority: High  . Diabetes mellitus type II, controlled ([place]) 05/14/2008    Priority: High  . Resistant hypertension 04/29/2007    Priority: High  . Hyperlipidemia 06/17/2007    Priority: Medium  . Sebaceous cyst 11/28/2007    Priority: Low  . Tinea versicolor 03/20/2017    Medications- reviewed and updated Current Outpatient Prescriptions  Medication Sig Dispense Refill  . amLODipine (NORVASC) 10 MG tablet Take 1 tablet (10 mg total) by mouth daily. 90 tablet 3  . aspirin EC 81 MG tablet Take 81 mg by mouth daily.    . ciclopirox (LOPROX) 0.77 % cream Apply 1 application topically 2 (two) times daily. 90 g 1  . nebivolol (BYSTOLIC) 10 MG tablet Take 1 tablet (10 mg total) by mouth daily. 90 tablet 3  . spironolactone (ALDACTONE) 25 MG tablet Take 0.5-1 tablets (12.5-25 mg total) by mouth daily. 30 tablet 5  Losartan-hct 100-25mg   Objective: BP (!) 146/98   Pulse 76   Temp 98.5 F (36.9 C) (Oral)   Ht 6\' 6"  (1.981 m)   Wt 287 lb 9.6 oz (130.5 kg)   SpO2 97%   BMI 33.24 kg/m  Gen: NAD, resting comfortably CV: RRR no murmurs rubs or gallops Lungs: CTAB no crackles, wheeze, rhonchi Abdomen: soft/nontender/nondistended obese Ext: minimal edema Skin: warm, dry  Assessment/Plan:  Resistant hypertension S: resistant HTN. Diagnosed age 1. On meds since 20- never controlled per his report. CT angiography without renal artery stenosis. Not episodic so did not test for pheochromocytoma. Aldosterone/renin levels were normal.   controlled poorly on amlodipine 10mg , losartn hct 100-25mg , bystolic 10mg . 2 weeks ago started spironolactone 12.5mg . BP Readings from Last 3  Encounters:  04/29/17 (!) 146/98  03/20/17 (!) 154/104  12/21/16 (!) 156/88  A/P: We discussed blood pressure goal of <140/90. Slightly improved today.   We opted to:  STOP losartan-hctz Start losartan 100mg  alone Start chlorthalidone 25mg  alone Get Bmet today Discussed weight loss and salt restriction may help  Next steps:  1. Lisinopril instead of losartan (tried in the past- does not report SE and states BP better) 2. Trial labetalol instead of bystolic 3. May trial off of spironolactone or beta blocker to see if BP increases. Consider lasix as alternative 4. Clonidine add on   Smoker Encouraged cessation still working on cutting back slowly. 1/2 ppd on days off. 1/4 PPD when working- discussed effects on BP  Meds ordered this encounter  Medications  . losartan (COZAAR) 100 MG tablet    Sig: Take 1 tablet (100 mg total) by mouth daily.    Dispense:  30 tablet    Refill:  5  . chlorthalidone (HYGROTON) 25 MG tablet    Sig: Take 1 tablet (25 mg total) by mouth daily.    Dispense:  30 tablet    Refill:  5  Pneumovax 23 given  The duration of face-to-face time during this visit was greater than 20 minutes. Greater than 50% of this time was spent in counseling, explanation of diagnosis, planning of further management, and/or coordination of care including discussion of Bp treatment options, weight loss needs, salt restrictoin.     Return  precautions advised.  Garret Reddish, MD

## 2017-05-13 MED FILL — AMLODIPINE BESYLATE 10 MG T: 10 | 90 days supply | Qty: 90 | Fill #1

## 2017-05-24 MED FILL — BYSTOLIC 10 MG TABLET: 10 | 30 days supply | Qty: 30 | Fill #4

## 2017-06-17 MED FILL — SPIRONOLACTONE 25 MG TABLET: 25 | 30 days supply | Qty: 30 | Fill #1

## 2017-06-20 ENCOUNTER — Encounter: Payer: Self-pay | Admitting: Family Medicine

## 2017-06-24 MED FILL — CHLORTHALIDONE 25 MG TAB: 25 | 30 days supply | Qty: 30 | Fill #0

## 2017-06-24 MED FILL — LOSARTAN POTASSIUM 100 MG T: 100 | 30 days supply | Qty: 30 | Fill #0

## 2017-07-01 MED FILL — BYSTOLIC 10 MG TABLET: 10 | 30 days supply | Qty: 30 | Fill #5

## 2017-07-29 MED FILL — CHLORTHALIDONE 25 MG TAB: 25 | 30 days supply | Qty: 30 | Fill #1

## 2017-07-29 MED FILL — LOSARTAN POTASSIUM 100 MG T: 100 | 30 days supply | Qty: 30 | Fill #1

## 2017-08-02 MED FILL — BYSTOLIC 10 MG TABLET: 10 | 30 days supply | Qty: 30 | Fill #6

## 2017-08-29 MED FILL — SPIRONOLACTONE 25 MG TABLET: 25 | 30 days supply | Qty: 30 | Fill #2

## 2017-08-29 MED FILL — AMLODIPINE BESYLATE 10 MG T: 10 | 90 days supply | Qty: 90 | Fill #2

## 2017-09-02 MED FILL — CHLORTHALIDONE 25 MG TAB: 25 | 30 days supply | Qty: 30 | Fill #2

## 2017-09-02 MED FILL — LOSARTAN POTASSIUM 100 MG T: 100 | 30 days supply | Qty: 30 | Fill #2

## 2017-09-02 MED FILL — BYSTOLIC 10 MG TABLET: 10 | 30 days supply | Qty: 30 | Fill #7

## 2017-10-07 MED FILL — LOSARTAN POTASSIUM 100 MG T: 100 | 30 days supply | Qty: 30 | Fill #3

## 2017-10-07 MED FILL — BYSTOLIC 10 MG TABLET: 10 | 30 days supply | Qty: 30 | Fill #8

## 2017-10-07 MED FILL — CHLORTHALIDONE 25 MG TAB: 25 | 30 days supply | Qty: 30 | Fill #3

## 2017-10-31 MED FILL — SPIRONOLACTONE 25 MG TABLET: 25 | 30 days supply | Qty: 30 | Fill #3

## 2017-11-11 MED FILL — LOSARTAN POTASSIUM 100 MG T: 100 | 30 days supply | Qty: 30 | Fill #4

## 2017-11-11 MED FILL — CHLORTHALIDONE 25 MG TAB: 25 | 30 days supply | Qty: 30 | Fill #4

## 2017-11-11 MED FILL — BYSTOLIC 10 MG TABLET: 10 | 30 days supply | Qty: 30 | Fill #9

## 2017-12-04 MED FILL — AMLODIPINE BESYLATE 10 MG T: 10 | 90 days supply | Qty: 90 | Fill #3

## 2017-12-10 MED FILL — BYSTOLIC 10 MG TABLET: 10 | 30 days supply | Qty: 30 | Fill #10

## 2017-12-10 MED FILL — LOSARTAN POTASSIUM 100 MG T: 100 | 30 days supply | Qty: 30 | Fill #5

## 2017-12-10 MED FILL — CHLORTHALIDONE 25 MG TAB: 25 | 30 days supply | Qty: 30 | Fill #5

## 2018-01-15 ENCOUNTER — Other Ambulatory Visit: Payer: Self-pay | Admitting: Family Medicine

## 2018-01-16 MED FILL — BYSTOLIC 10 MG TABLET: 10 | 90 days supply | Qty: 90 | Fill #0

## 2018-01-16 MED FILL — CHLORTHALIDONE 25 MG TAB: 25 | 30 days supply | Qty: 30 | Fill #0

## 2018-01-16 MED FILL — SPIRONOLACTONE 25 MG TABLET: 25 | 30 days supply | Qty: 30 | Fill #4

## 2018-01-16 MED FILL — LOSARTAN POTASSIUM 100 MG T: 100 | 30 days supply | Qty: 30 | Fill #0

## 2018-01-24 ENCOUNTER — Encounter: Payer: Self-pay | Admitting: Family Medicine

## 2018-01-24 ENCOUNTER — Ambulatory Visit (INDEPENDENT_AMBULATORY_CARE_PROVIDER_SITE_OTHER): Payer: No Typology Code available for payment source | Admitting: Family Medicine

## 2018-01-24 VITALS — BP 136/98 | HR 77 | Temp 98.1°F | Ht 78.0 in | Wt 290.2 lb

## 2018-01-24 DIAGNOSIS — B360 Pityriasis versicolor: Secondary | ICD-10-CM

## 2018-01-24 DIAGNOSIS — I10 Essential (primary) hypertension: Secondary | ICD-10-CM | POA: Diagnosis not present

## 2018-01-24 DIAGNOSIS — E119 Type 2 diabetes mellitus without complications: Secondary | ICD-10-CM

## 2018-01-24 DIAGNOSIS — R002 Palpitations: Secondary | ICD-10-CM | POA: Diagnosis not present

## 2018-01-24 DIAGNOSIS — F17200 Nicotine dependence, unspecified, uncomplicated: Secondary | ICD-10-CM

## 2018-01-24 LAB — COMPREHENSIVE METABOLIC PANEL
ALT: 23 U/L (ref 0–53)
AST: 17 U/L (ref 0–37)
Albumin: 4.4 g/dL (ref 3.5–5.2)
Alkaline Phosphatase: 94 U/L (ref 39–117)
BILIRUBIN TOTAL: 0.8 mg/dL (ref 0.2–1.2)
BUN: 13 mg/dL (ref 6–23)
CALCIUM: 10.1 mg/dL (ref 8.4–10.5)
CO2: 26 meq/L (ref 19–32)
CREATININE: 1.05 mg/dL (ref 0.40–1.50)
Chloride: 101 mEq/L (ref 96–112)
GFR: 96.6 mL/min (ref >60.00)
Glucose, Bld: 176 mg/dL — ABNORMAL HIGH (ref 70–99)
Potassium: 3.5 mEq/L (ref 3.5–5.1)
Sodium: 137 mEq/L (ref 135–145)
Total Protein: 7.7 g/dL (ref 6.0–8.3)

## 2018-01-24 LAB — CBC
HCT: 43.8 % (ref 39.0–52.0)
Hemoglobin: 15.3 g/dL (ref 13.0–17.0)
MCHC: 34.9 g/dL (ref 30.0–36.0)
MCV: 89.6 fl (ref 78.0–100.0)
PLATELETS: 285 10*3/uL (ref 150.0–400.0)
RBC: 4.89 Mil/uL (ref 4.22–5.81)
RDW: 13.2 % (ref 11.5–15.5)
WBC: 8.2 10*3/uL (ref 4.0–10.5)

## 2018-01-24 LAB — POCT GLYCOSYLATED HEMOGLOBIN (HGB A1C): HEMOGLOBIN A1C: 6.5 % — AB (ref 4.0–5.6)

## 2018-01-24 LAB — LDL CHOLESTEROL, DIRECT: Direct LDL: 130 mg/dL

## 2018-01-24 MED ORDER — KETOCONAZOLE 2 % EX CREA: 1.0000 "application " | TOPICAL_CREAM | Freq: Every day | CUTANEOUS | 1 refills | Status: DC

## 2018-01-24 MED ORDER — LISINOPRIL 40 MG PO TABS: 40.0000 mg | ORAL_TABLET | Freq: Every day | ORAL | 3 refills | Status: DC

## 2018-01-24 NOTE — Assessment & Plan Note (Signed)
S: Resistant hypertension-patient diagnosed at age 47 and on meds since 31.  Has already had CT angiography which did not show renal artery stenosis.  Controlled poorly at last visit.  We stopped his losartan-hydrochlorothiazide and started losartan 100 mg and chlorthalidone 25 mg.  He is also on amlodipine 10 mg and spironolactone 79.8 mg and bystolic 10mg .   We also discussed the importance of weight loss and salt restriction.   From last note  "Next steps:  1. Lisinopril instead of losartan (tried in the past- does not report SE and states BP better) 2. Trial labetalol instead of bystolic 3. May trial off of spironolactone or beta blocker to see if BP increases. Consider lasix as alternative 4. Clonidine add on "  Today- Home #s have usually been 140 over high 80s or low 90s.  BP Readings from Last 3 Encounters:  01/24/18 (!) 136/98  04/29/17 (!) 146/98  03/20/17 (!) 154/104  A/P: We discussed blood pressure goal of <140/90. Continue current meds:  chlorthalidine 25mg , amlodipine 10mg , spironolactone 12.5mg , bystolic 10mg .  - he had been on lisinopril in past with better blood pressures compared to losartan so we will switch to lisinopril 40mg  - not rechecking bmet until next visit- I realize on spironolactone and lisinopril but has been on losartan already and doing ok in regards to potassium

## 2018-01-24 NOTE — Patient Instructions (Addendum)
Health Maintenance Due  Topic Date Due  . HEMOGLOBIN A1C -done today- looks good at 6.5.  10/05/2017  . FOOT EXAM -done today 12/21/2017   We will call you within a week or two about your referral to cardiology. If you do not hear within 3 weeks, give [place] a call.   Handout for hypnotism In quitting smoking  Stop losartan- start lisinopril  Lets chat about under the arms and in the groin more next visit- weight loss can help some but doesn't get rid of it  Please stop by lab before you go

## 2018-01-24 NOTE — Assessment & Plan Note (Signed)
S: Diet controlled in the past.  Weight up slightly- we discussed reversing this trend Lab Results  Component Value Date   HGBA1C 6.5 (A) 01/24/2018   HGBA1C 6.4 04/04/2017   HGBA1C 6.7 (H) 12/17/2016   A/P: Updated hemoglobin A1c today is reasonable

## 2018-01-24 NOTE — Assessment & Plan Note (Signed)
Trial ketoconazole instead of ciclopirox- not helping as much as he would like

## 2018-01-24 NOTE — Progress Notes (Addendum)
Subjective:  Carlos Patterson is a 47 y.o. year old very pleasant male patient who presents for/with See problem oriented charting ROS- denies hypoglycemia. Has had palpitations and shortness of breath but no chest pain.    Past Medical History-  Patient Active Problem List   Diagnosis Date Noted  . Smoker 10/02/2016    Priority: High  . Diabetes mellitus type II, controlled ([place]) 05/14/2008    Priority: High  . Resistant hypertension 04/29/2007    Priority: High  . Palpitations 01/24/2018    Priority: Medium  . Hyperlipidemia 06/17/2007    Priority: Medium  . Sebaceous cyst 11/28/2007    Priority: Low  . Hidradenitis suppurativa 02/21/2018  . Tinea versicolor 03/20/2017    Medications- reviewed and updated Current Outpatient Medications  Medication Sig Dispense Refill  . amLODipine (NORVASC) 10 MG tablet Take 1 tablet (10 mg total) by mouth daily. 90 tablet 3  . aspirin EC 81 MG tablet Take 81 mg by mouth daily.    Marland Kitchen BYSTOLIC 10 MG tablet TAKE 1 TABLET BY MOUTH ONCE DAILY 90 tablet 3  . chlorthalidone (HYGROTON) 25 MG tablet TAKE 1 TABLET BY MOUTH ONCE DAILY 30 tablet 5  . ciclopirox (LOPROX) 0.77 % cream Apply 1 application topically 2 (two) times daily. 90 g 1  . spironolactone (ALDACTONE) 25 MG tablet Take 0.5-1 tablets (12.5-25 mg total) by mouth daily. 30 tablet 5  . atorvastatin (LIPITOR) 20 MG tablet Take 1 tablet (20 mg total) by mouth once a week. 13 tablet 1  . clindamycin (CLINDAGEL) 1 % gel Apply topically 2 (two) times daily. To boils/pimples in axilla and groin for up to 7 days 60 g 2  . ketoconazole (NIZORAL) 2 % cream Apply 1 application topically daily. For tinea versicolor 60 g 1  . lisinopril (PRINIVIL,ZESTRIL) 40 MG tablet Take 1 tablet (40 mg total) by mouth daily. 90 tablet 3   No current facility-administered medications for this visit.     Objective: BP (!) 136/98 (BP Location: Left Arm, Patient Position: Sitting, Cuff Size: Large)   Pulse 77    Temp 98.1 F (36.7 C) (Oral)   Ht 6\' 6"  (1.981 m)   Wt 290 lb 3.2 oz (131.6 kg)   SpO2 96%   BMI 33.54 kg/m  Gen: NAD, resting comfortably CV: RRR no murmurs rubs or gallops Lungs: CTAB no crackles, wheeze, rhonchi Abdomen: soft/nontender/nondistended/normal bowel sounds. overweight Skin: warm, dry  Diabetic Foot Exam - Simple   Simple Foot Form Diabetic Foot exam was performed with the following findings:  Yes 01/24/2018  8:53 AM  Visual Inspection No deformities, no ulcerations, no other skin breakdown bilaterally:  Yes Sensation Testing Intact to touch and monofilament testing bilaterally:  Yes Pulse Check Posterior Tibialis and Dorsalis pulse intact bilaterally:  Yes Comments    Assessment/Plan:  Other notes: 1.start next conversation about hidradenitis suppurtiva  Diabetes mellitus type II, controlled ([place]) S: Diet controlled in the past.  Weight up slightly- we discussed reversing this trend Lab Results  Component Value Date   HGBA1C 6.5 (A) 01/24/2018   HGBA1C 6.4 04/04/2017   HGBA1C 6.7 (H) 12/17/2016   A/P: Updated hemoglobin A1c today is reasonable  Tinea versicolor Trial ketoconazole instead of ciclopirox- not helping as much as he would like  Palpitations S: about a month ago was at home and getting ready to go to work. Had been noting palpitations every few months. Breathing felt labored when this happened. Heart rate went up to 153  on home BP monitor- BP was 111/88.   Has had palpitations for about a year and seems to be getting more frequent now up to about every 4-6 weeks. Usually doesn't happen with exercise.  A/P: with his long term history of hypertension since age 79 (thus increasing cardiac risk) that has been difficult to control and seemingly more frequent palpitations. Also with the shortness of breath with these episodes I think it makes sense to refer him to cardiology. I wonder about atrial fibrillation with elevated heart rate- if has recurrent  issues may benefit from holter monitor  Resistant hypertension S: Resistant hypertension-patient diagnosed at age 60 and on meds since 17.  Has already had CT angiography which did not show renal artery stenosis.  Controlled poorly at last visit.  We stopped his losartan-hydrochlorothiazide and started losartan 100 mg and chlorthalidone 25 mg.  He is also on amlodipine 10 mg and spironolactone 81.2 mg and bystolic 10mg .   We also discussed the importance of weight loss and salt restriction.   From last note  "Next steps:  1. Lisinopril instead of losartan (tried in the past- does not report SE and states BP better) 2. Trial labetalol instead of bystolic 3. May trial off of spironolactone or beta blocker to see if BP increases. Consider lasix as alternative 4. Clonidine add on "  Today- Home #s have usually been 140 over high 80s or low 90s.  BP Readings from Last 3 Encounters:  01/24/18 (!) 136/98  04/29/17 (!) 146/98  03/20/17 (!) 154/104  A/P: We discussed blood pressure goal of <140/90. Continue current meds:  chlorthalidine 25mg , amlodipine 10mg , spironolactone 12.5mg , bystolic 10mg .  - he had been on lisinopril in past with better blood pressures compared to losartan so we will switch to lisinopril 40mg  - not rechecking bmet until next visit- I realize on spironolactone and lisinopril but has been on losartan already and doing ok in regards to potassium  Smoker 1/2 PPD days off, 1/4 pack on work days still. Encouraged quitting smoking- he is not ready- we did discuss trying gum more frequently- does seem to help him a twork   Future Appointments  Date Time Provider [place]  03/21/2018 10:00 AM 04/03/2018, MD [facility]  06/06/2018 11:15 AM 23/12/2017, MD [facility]   Return in about 3 months (around 05/02/2018) for physical.  Lab/Order associations: Controlled type 2 diabetes mellitus without complication, without long-term current use of  insulin ([place]) - Plan: POCT glycosylated hemoglobin (Hb A1C), CBC, Comprehensive metabolic panel, LDL cholesterol, direct  Palpitations - Plan: Ambulatory referral to Cardiology  Resistant hypertension - Plan: Ambulatory referral to Cardiology  Meds ordered this encounter  Medications  . lisinopril (PRINIVIL,ZESTRIL) 40 MG tablet    Sig: Take 1 tablet (40 mg total) by mouth daily.    Dispense:  90 tablet    Refill:  3  . ketoconazole (NIZORAL) 2 % cream    Sig: Apply 1 application topically daily. For tinea versicolor    Dispense:  60 g    Refill:  1   Return precautions advised.  [address], MD

## 2018-01-24 NOTE — Progress Notes (Signed)
Please let us know by responding to this when you get this mychart message and let us know if you understand the results/directions  Your CBC was normal (blood counts, infection fighting cells, platelets). Your CMET was normal (kidney, liver, and electrolytes, blood sugar) except for blood sugar being high- but obviously you have diabetes so this isn't a major concern.   Your cholesterol is above goal. Lets try atorvastatin 20mg  once a week (team can you send in #5 with 5 refills or #13 with 1 refill per patient preference)- hopefully no side effects but you will still see some of benefits.

## 2018-01-24 NOTE — Assessment & Plan Note (Signed)
1/2 PPD days off, 1/4 pack on work days still. Encouraged quitting smoking- he is not ready- we did discuss trying gum more frequently- does seem to help him a twork

## 2018-01-24 NOTE — Assessment & Plan Note (Addendum)
S: about a month ago was at home and getting ready to go to work. Had been noting palpitations every few months. Breathing felt labored when this happened. Heart rate went up to 153 on home BP monitor- BP was 111/88.   Has had palpitations for about a year and seems to be getting more frequent now up to about every 4-6 weeks. Usually doesn't happen with exercise.  A/P: with his long term history of hypertension since age 47 (thus increasing cardiac risk) that has been difficult to control and seemingly more frequent palpitations. Also with the shortness of breath with these episodes I think it makes sense to refer him to cardiology. I wonder about atrial fibrillation with elevated heart rate- if has recurrent issues may benefit from holter monitor

## 2018-01-29 ENCOUNTER — Other Ambulatory Visit: Payer: Self-pay

## 2018-01-29 ENCOUNTER — Encounter: Payer: Self-pay | Admitting: Family Medicine

## 2018-01-29 MED ORDER — ATORVASTATIN CALCIUM 20 MG PO TABS: 20.0000 mg | ORAL_TABLET | ORAL | 1 refills | Status: DC

## 2018-01-29 MED FILL — ATORVASTATIN 20 MG TABLET: 20 | 90 days supply | Qty: 13 | Fill #0

## 2018-02-03 MED FILL — LISINOPRIL 40 MG TABLET: 40 | 90 days supply | Qty: 90 | Fill #0

## 2018-02-19 MED FILL — CHLORTHALIDONE 25 MG TAB: 25 | 30 days supply | Qty: 30 | Fill #1

## 2018-02-21 ENCOUNTER — Ambulatory Visit (INDEPENDENT_AMBULATORY_CARE_PROVIDER_SITE_OTHER): Payer: No Typology Code available for payment source | Admitting: Family Medicine

## 2018-02-21 ENCOUNTER — Encounter: Payer: Self-pay | Admitting: Family Medicine

## 2018-02-21 VITALS — BP 132/82 | HR 72 | Temp 97.9°F | Ht 78.0 in | Wt 287.8 lb

## 2018-02-21 DIAGNOSIS — Z0000 Encounter for general adult medical examination without abnormal findings: Secondary | ICD-10-CM | POA: Diagnosis not present

## 2018-02-21 DIAGNOSIS — R002 Palpitations: Secondary | ICD-10-CM | POA: Diagnosis not present

## 2018-02-21 DIAGNOSIS — E119 Type 2 diabetes mellitus without complications: Secondary | ICD-10-CM

## 2018-02-21 DIAGNOSIS — Z125 Encounter for screening for malignant neoplasm of prostate: Secondary | ICD-10-CM

## 2018-02-21 DIAGNOSIS — F17200 Nicotine dependence, unspecified, uncomplicated: Secondary | ICD-10-CM

## 2018-02-21 DIAGNOSIS — E785 Hyperlipidemia, unspecified: Secondary | ICD-10-CM | POA: Diagnosis not present

## 2018-02-21 DIAGNOSIS — L732 Hidradenitis suppurativa: Secondary | ICD-10-CM

## 2018-02-21 DIAGNOSIS — I10 Essential (primary) hypertension: Secondary | ICD-10-CM

## 2018-02-21 LAB — COMPREHENSIVE METABOLIC PANEL
ALK PHOS: 103 U/L (ref 39–117)
ALT: 23 U/L (ref 0–53)
AST: 17 U/L (ref 0–37)
Albumin: 4.7 g/dL (ref 3.5–5.2)
BILIRUBIN TOTAL: 0.6 mg/dL (ref 0.2–1.2)
BUN: 18 mg/dL (ref 6–23)
CALCIUM: 10.4 mg/dL (ref 8.4–10.5)
CO2: 28 meq/L (ref 19–32)
Chloride: 102 mEq/L (ref 96–112)
Creatinine, Ser: 1.01 mg/dL (ref 0.40–1.50)
GFR: 101 mL/min (ref >60.00)
Glucose, Bld: 128 mg/dL — ABNORMAL HIGH (ref 70–99)
Potassium: 4.3 mEq/L (ref 3.5–5.1)
Sodium: 136 mEq/L (ref 135–145)
TOTAL PROTEIN: 7.8 g/dL (ref 6.0–8.3)

## 2018-02-21 LAB — CBC
HEMATOCRIT: 43.6 % (ref 39.0–52.0)
Hemoglobin: 15.3 g/dL (ref 13.0–17.0)
MCHC: 35 g/dL (ref 30.0–36.0)
MCV: 90 fl (ref 78.0–100.0)
Platelets: 309 10*3/uL (ref 150.0–400.0)
RBC: 4.84 Mil/uL (ref 4.22–5.81)
RDW: 13.1 % (ref 11.5–15.5)
WBC: 8.3 10*3/uL (ref 4.0–10.5)

## 2018-02-21 LAB — POC URINALSYSI DIPSTICK (AUTOMATED)
Bilirubin, UA: NEGATIVE
Blood, UA: NEGATIVE
Glucose, UA: NEGATIVE
KETONES UA: NEGATIVE
Leukocytes, UA: NEGATIVE
Nitrite, UA: NEGATIVE
PH UA: 5 (ref 5.0–8.0)
PROTEIN UA: NEGATIVE
Spec Grav, UA: >=1.03 — AB (ref 1.010–1.025)
Urobilinogen, UA: 0.2 E.U./dL

## 2018-02-21 LAB — PSA: PSA: 1.31 ng/mL (ref 0.10–4.00)

## 2018-02-21 LAB — LDL CHOLESTEROL, DIRECT: LDL DIRECT: 120 mg/dL

## 2018-02-21 MED ORDER — CLINDAMYCIN PHOSPHATE 1 % EX GEL: Freq: Two times a day (BID) | CUTANEOUS | 2 refills | Status: DC

## 2018-02-21 MED FILL — CLINDAMYCIN PH 1% GEL: 1 | 7 days supply | Qty: 60 | Fill #0

## 2018-02-21 NOTE — Addendum Note (Signed)
Addended by: Frutoso Chase A on: 02/21/2018 12:47 PM   Modules accepted: Orders

## 2018-02-21 NOTE — Assessment & Plan Note (Signed)
For palpitations - he is set up with cardiology on the 19th of July. Luckily has not had any recent palpitations.

## 2018-02-21 NOTE — Progress Notes (Signed)
Phone: [phone]  Subjective:  Patient presents today for their annual physical. Chief complaint-noted.   See problem oriented charting- ROS- full  review of systems was completed and negative except for: pimples/boils in axilla and groin at times. Large bumps on his back- sebaceous cyst history extensive  The following were reviewed and entered/updated in epic: Past Medical History:  Diagnosis Date  . Acne vulgaris   . Chicken pox   . Diabetes mellitus   . Hyperlipidemia   . Hypertension    Patient Active Problem List   Diagnosis Date Noted  . Smoker 10/02/2016    Priority: High  . Diabetes mellitus type II, controlled ([place]) 05/14/2008    Priority: High  . Resistant hypertension 04/29/2007    Priority: High  . Palpitations 01/24/2018    Priority: Medium  . Hyperlipidemia 06/17/2007    Priority: Medium  . Sebaceous cyst 11/28/2007    Priority: Low  . Hidradenitis suppurativa 02/21/2018  . Tinea versicolor 03/20/2017   Past Surgical History:  Procedure Laterality Date  . PILONIDAL CYST EXCISION     removal    Family History  Problem Relation Age of Onset  . Hyperlipidemia Unknown   . Aneurysm Mother        brain. passed 2003. complained of HA one day and passed next day  . Diabetes Mother   . Other Father        does not talk much- not a good relationship. unknown  . CVA Maternal Grandmother        x5  . Alcohol abuse Maternal Grandmother   . Hypertension Maternal Grandfather     Medications- reviewed and updated Current Outpatient Medications  Medication Sig Dispense Refill  . amLODipine (NORVASC) 10 MG tablet Take 1 tablet (10 mg total) by mouth daily. 90 tablet 3  . aspirin EC 81 MG tablet Take 81 mg by mouth daily.    Marland Kitchen atorvastatin (LIPITOR) 20 MG tablet Take 1 tablet (20 mg total) by mouth once a week. 13 tablet 1  . BYSTOLIC 10 MG tablet TAKE 1 TABLET BY MOUTH ONCE DAILY 90 tablet 3  . chlorthalidone (HYGROTON) 25 MG tablet TAKE 1 TABLET BY  MOUTH ONCE DAILY 30 tablet 5  . ciclopirox (LOPROX) 0.77 % cream Apply 1 application topically 2 (two) times daily. 90 g 1  . ketoconazole (NIZORAL) 2 % cream Apply 1 application topically daily. For tinea versicolor 60 g 1  . lisinopril (PRINIVIL,ZESTRIL) 40 MG tablet Take 1 tablet (40 mg total) by mouth daily. 90 tablet 3  . spironolactone (ALDACTONE) 25 MG tablet Take 0.5-1 tablets (12.5-25 mg total) by mouth daily. 30 tablet 5  . clindamycin (CLINDAGEL) 1 % gel Apply topically 2 (two) times daily. To boils/pimples in axilla and groin for up to 7 days 60 g 2   No current facility-administered medications for this visit.     Allergies-reviewed and updated No Known Allergies  Social History   Social History Narrative   Married 2000. Step daughter and biological daughter. 23 and 17 in 2018- HS senior [employer].    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    College at [employer] finished.       Hobbies: [place]    Objective: BP 132/82 (BP Location: Left Arm, Patient Position: Sitting, Cuff Size: Large)   Pulse 72   Temp 97.9 F (36.6 C) (Oral)   Ht 6\' 6"  (1.981 m)  Wt 287 lb 12.8 oz (130.5 kg)   SpO2 95%   BMI 33.26 kg/m  Gen: NAD, resting comfortably HEENT: Mucous membranes are moist. Oropharynx normal Neck: no thyromegaly CV: RRR no murmurs rubs or gallops Lungs: CTAB no crackles, wheeze, rhonchi Abdomen: soft/nontender/nondistended/normal bowel sounds. overweight  Ext: no edema Skin: warm, dry Neuro: grossly normal, moves all extremities, PERRLA Rectal: normal tone, normal sized prostate, no masses or tenderness   Assessment/Plan:  47 y.o. male presenting for annual physical.  Health Maintenance counseling: 1. Anticipatory guidance: Patient counseled regarding regular dental exams -q6 months advised- hasnt been seeing, eye exams - within last year- encouraged follow up especially with intermittent mild  blurry vision- likely far sightedness, wearing seatbelts.  2. Risk factor reduction:  Advised patient of need for regular exercise and diet rich and fruits and vegetables to reduce risk of heart attack and stroke. Exercise- 3 days a week. Diet-was in the [place] and was eating fish, fruit, etc- somehow lost 3 lbs! Discussed healthy eating habits.  Wt Readings from Last 3 Encounters:  02/21/18 287 lb 12.8 oz (130.5 kg)  01/24/18 290 lb 3.2 oz (131.6 kg)  04/29/17 287 lb 9.6 oz (130.5 kg)  3. Immunizations/screenings/ancillary studies- up to date Immunization History  Administered Date(s) Administered  . Influenza-Unspecified 06/20/2016  . Pneumococcal Polysaccharide-23 04/29/2017  . Tdap 03/20/2017  4. Prostate cancer screening-  trend PSA given african [place].  Lab Results  Component Value Date   PSA 1.67 04/04/2017   5. Colon cancer screening - no family history, start at age 52 6.  STD screening- patient opts out- monogamous   Status of chronic or acute concerns   Resistant hypertension resistant hypertension- very pleased controlled for first time that he or I can remember. Change to lisinopril seems to have helped. See last note about next steps if needed- right now on chlorthalidone 25mg , amlodipine 10mg , bystolic 10mg , spironolactone 12.5mg , lisinopril 40mg . Update bmet  Diabetes mellitus type II, controlled ([place]) Diabetes- well controlled- will give him a meter just to check next time he has blurry vision to make sure sugar not below 70 or above 250.   Smoker Smoker- down to 3 on workdays and 7 on off days- this is a big improvement for him- encouraged him to continue to cut back   Hidradenitis suppurativa Hidradenitis supportiva- gets boils in groin and axilla since at least teenage years. One lesion in groin and one in axilla noted today- will trial topical clindamycin  Palpitations For palpitations - he is set up with cardiology on the 19th of July. Luckily has not had any  recent palpitations.    Hyperlipidemia HLD- atorvastatin once a week starting about a month ago on mondays. Going to repeat direct LDL though sometimes it takes more like 6 weeks to see full benefit   Future Appointments  Date Time Provider [place]  03/21/2018 10:00 AM Buford Dresser, MD [facility]   Lab/Order associations: Preventative health care - Plan: CBC, Comprehensive metabolic panel, PSA, LDL cholesterol, direct, POCT Urinalysis Dipstick (Automated)  Hyperlipidemia, unspecified hyperlipidemia type - Plan: CBC, Comprehensive metabolic panel, LDL cholesterol, direct, POCT Urinalysis Dipstick (Automated)  Screening for prostate cancer - Plan: PSA  Meds ordered this encounter  Medications  . clindamycin (CLINDAGEL) 1 % gel    Sig: Apply topically 2 (two) times daily. To boils/pimples in axilla and groin for up to 7 days    Dispense:  60 g    Refill:  2   Return  precautions advised.  Garret Reddish, MD

## 2018-02-21 NOTE — Progress Notes (Signed)
Your CBC was normal (blood counts, infection fighting cells, platelets). Your CMET was normal (kidney, liver, and electrolytes, blood sugar) for someone with diabetes.  Your cholesterol has shown slight improvement- though still not at ideal levels a bad cholesterol of 221 is certainly better than the 140s to 150s seen several years ago and it showed veyr mild improvement from last labs.  Your PSA trend is low risk for prostate cancer

## 2018-02-21 NOTE — Assessment & Plan Note (Signed)
Hidradenitis supportiva- gets boils in groin and axilla since at least teenage years. One lesion in groin and one in axilla noted today- will trial topical clindamycin

## 2018-02-21 NOTE — Patient Instructions (Addendum)
Get plugged in with a dentist  Get updated eye exam in late July   will give him a meter just to check next time he has blurry vision to make sure sugar not below 70 or above 250.   3-4 month follow up to check on a1c  Please stop by lab before you go

## 2018-02-21 NOTE — Assessment & Plan Note (Signed)
Smoker- down to 3 on workdays and 7 on off days- this is a big improvement for him- encouraged him to continue to cut back

## 2018-02-21 NOTE — Assessment & Plan Note (Signed)
HLD- atorvastatin once a week starting about a month ago on mondays. Going to repeat direct LDL though sometimes it takes more like 6 weeks to see full benefit

## 2018-02-21 NOTE — Assessment & Plan Note (Signed)
resistant hypertension- very pleased controlled for first time that he or I can remember. Change to lisinopril seems to have helped. See last note about next steps if needed- right now on chlorthalidone 25mg , amlodipine 10mg , bystolic 10mg , spironolactone 12.5mg , lisinopril 40mg . Update bmet

## 2018-02-21 NOTE — Assessment & Plan Note (Signed)
Diabetes- well controlled- will give him a meter just to check next time he has blurry vision to make sure sugar not below 70 or above 250.

## 2018-02-28 MED FILL — KETOCONAZOLE 2% CREAM: 2 | 20 days supply | Qty: 60 | Fill #0

## 2018-03-17 ENCOUNTER — Other Ambulatory Visit: Payer: Self-pay | Admitting: Family Medicine

## 2018-03-17 MED FILL — AMLODIPINE BESYLATE 10 MG T: 10 | 90 days supply | Qty: 90 | Fill #0

## 2018-03-17 MED FILL — SPIRONOLACTONE 25 MG TABS: 25 | 30 days supply | Qty: 30 | Fill #5

## 2018-03-21 ENCOUNTER — Encounter: Payer: Self-pay | Admitting: Cardiology

## 2018-03-21 ENCOUNTER — Ambulatory Visit (INDEPENDENT_AMBULATORY_CARE_PROVIDER_SITE_OTHER): Payer: No Typology Code available for payment source | Admitting: Cardiology

## 2018-03-21 VITALS — BP 130/88 | HR 80 | Ht 79.0 in | Wt 283.8 lb

## 2018-03-21 DIAGNOSIS — Z716 Tobacco abuse counseling: Secondary | ICD-10-CM

## 2018-03-21 DIAGNOSIS — E785 Hyperlipidemia, unspecified: Secondary | ICD-10-CM

## 2018-03-21 DIAGNOSIS — E119 Type 2 diabetes mellitus without complications: Secondary | ICD-10-CM

## 2018-03-21 DIAGNOSIS — Z7189 Other specified counseling: Secondary | ICD-10-CM

## 2018-03-21 DIAGNOSIS — N521 Erectile dysfunction due to diseases classified elsewhere: Secondary | ICD-10-CM

## 2018-03-21 DIAGNOSIS — R002 Palpitations: Secondary | ICD-10-CM

## 2018-03-21 DIAGNOSIS — I10 Essential (primary) hypertension: Secondary | ICD-10-CM

## 2018-03-21 DIAGNOSIS — F17200 Nicotine dependence, unspecified, uncomplicated: Secondary | ICD-10-CM

## 2018-03-21 DIAGNOSIS — Z6832 Body mass index (BMI) 32.0-32.9, adult: Secondary | ICD-10-CM | POA: Diagnosis not present

## 2018-03-21 DIAGNOSIS — E6609 Other obesity due to excess calories: Secondary | ICD-10-CM | POA: Diagnosis not present

## 2018-03-21 NOTE — Patient Instructions (Signed)
Medication Instructions: Your physician recommends that you continue on your current medications as directed.    If you need a refill on your cardiac medications before your next appointment, please call your pharmacy.   Labwork: None  Procedures/Testing: None  Follow-Up: Your physician wants you to follow-up as needed with Dr. Harrell Gave. Call our office at [phone] to schedule appointment.   Special Instructions:    Thank you for choosing Heartcare at [facility]!!

## 2018-03-21 NOTE — Progress Notes (Signed)
Cardiology Office Note:    Date:  03/21/2018   ID:  Carlos Patterson, DOB 1970/11/29, MRN [id number]  PCP:  Marin Olp, MD  Cardiologist:  Buford Dresser, MD PhD  Referring MD: Marin Olp, MD   Chief Complaint  Patient presents with  . Palpitations    History of Present Illness:    Carlos Patterson is a 47 y.o. male with a hx of hypertension, diabetes, tobacco use and hyperlipidemia who is seen as a new consult at the request of Dr. Yong Channel for evaluation of palpitations.  Palpitations: Hasn't experienced in several months. Last episode lasted about 15 min, previously could occur at any time. Feels fast beating, breathing gets short, gets lightheaded. Chest feels tight when this occurs. No radiation. No increase in sweating, no nausea. Drinks about 1.5 cups of coffee per day, not related. Working on quitting smoking, about a half pack or less per day. Peak was about a ppd in his 60s. Drinks 1-2 glasses of wine/day. Palpitations not related to alcohol or smoking. No cocaine. Started bystolic about a year ago.  HTN: rarely checks BP at home, does note that the readings are lower at home when he does check. No headaches or vision changes. HTN runs rampant in his family (see below). Renal arteries open on CT scan last year, Renin/aldosterone workup unremarkable. Has required multiple medications (below) to control.  HLD: had leg swelling on simvastatin in the past. Now on atorvastatin weekly and tolerating this well.  No other chest tightness or shortness of breath (outside of the palpitations). Climb stairs without issues. Walk 3-5 times/week for about an hour each time. No PND or orthopnea. Does snore when he sleeps (very loudly per his family). Never had a sleep study, will consider. No real daytime sleepiness. No significant edema.  ROS is notable for erectile dysfunction, which he plans to discuss with his PCP.  FH: He is an only child. HTN runs extensively in  family. His 4 year old daughter was 49 when she was diagnosed. No HF. Mom died of aneurysm age 31, had HTN. Gma had HTN and strokes. No one on dialysis. No sudden cardiac death.  Past Medical History:  Diagnosis Date  . Acne vulgaris   . Chicken pox   . Diabetes mellitus   . Hyperlipidemia   . Hypertension     Past Surgical History:  Procedure Laterality Date  . PILONIDAL CYST EXCISION     removal    Current Medications: Current Outpatient Medications on File Prior to Visit  Medication Sig  . amLODipine (NORVASC) 10 MG tablet TAKE 1 TABLET BY MOUTH ONCE DAILY  . aspirin EC 81 MG tablet Take 81 mg by mouth daily.  Marland Kitchen atorvastatin (LIPITOR) 20 MG tablet Take 1 tablet (20 mg total) by mouth once a week.  Marland Kitchen BYSTOLIC 10 MG tablet TAKE 1 TABLET BY MOUTH ONCE DAILY  . chlorthalidone (HYGROTON) 25 MG tablet TAKE 1 TABLET BY MOUTH ONCE DAILY  . ciclopirox (LOPROX) 0.77 % cream Apply 1 application topically 2 (two) times daily.  . clindamycin (CLINDAGEL) 1 % gel Apply topically 2 (two) times daily. To boils/pimples in axilla and groin for up to 7 days  . ketoconazole (NIZORAL) 2 % cream Apply 1 application topically daily. For tinea versicolor  . lisinopril (PRINIVIL,ZESTRIL) 40 MG tablet Take 1 tablet (40 mg total) by mouth daily.  Marland Kitchen spironolactone (ALDACTONE) 25 MG tablet Take 0.5-1 tablets (12.5-25 mg total) by mouth daily.   No  current facility-administered medications on file prior to visit.      Allergies:   Patient has no known allergies.   Social History   Socioeconomic History  . Marital status: Married    Spouse name: Not on file  . Number of children: Not on file  . Years of education: Not on file  . Highest education level: Not on file  Occupational History  . Not on file  Social Needs  . Financial resource strain: Not on file  . Food insecurity:    Worry: Not on file    Inability: Not on file  . Transportation needs:    Medical: Not on file    Non-medical: Not  on file  Tobacco Use  . Smoking status: Current Every Day Smoker    Packs/day: 0.50    Types: Cigarettes  . Smokeless tobacco: Never Used  Substance and Sexual Activity  . Alcohol use: Yes    Alcohol/week: 0.6 - 1.2 oz    Types: 1 - 2 Glasses of wine per week  . Drug use: Yes    Types: Marijuana    Comment: sparing  . Sexual activity: Not on file  Lifestyle  . Physical activity:    Days per week: Not on file    Minutes per session: Not on file  . Stress: Not on file  Relationships  . Social connections:    Talks on phone: Not on file    Gets together: Not on file    Attends religious service: Not on file    Active member of club or organization: Not on file    Attends meetings of clubs or organizations: Not on file    Relationship status: Not on file  Other Topics Concern  . Not on file  Social History Narrative   Married 2000. Step daughter and biological daughter. 23 and 17 in 2018- HS senior [employer].    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    College at [employer] finished.       Hobbies: [place]     Family History: The patient's family history includes Alcohol abuse in his maternal grandmother; Aneurysm in his mother; CVA in his maternal grandmother; Diabetes in his mother; Hyperlipidemia in his unknown relative; Hypertension in his maternal grandfather; Other in his father. FH: He is an only child. HTN runs extensively in family. His 69 year old daughter was 34 when she was diagnosed. No HF. Mom died of aneurysm age 53, had HTN. Gma had HTN and strokes. No one on dialysis. No sudden cardiac death.  ROS:   Please see the history of present illness.  Additional pertinent ROS: Review of Systems  Constitution: Negative for chills, fever and night sweats.  HENT: Negative for ear pain, odynophagia and sore throat.   Eyes: Negative for blurred vision and pain.  Cardiovascular: Positive for palpitations.  Negative for chest pain, claudication, dyspnea on exertion, leg swelling, orthopnea, paroxysmal nocturnal dyspnea and syncope.  Respiratory: Positive for snoring. Negative for cough, hemoptysis and sputum production.   Endocrine: Negative for cold intolerance and heat intolerance.  Hematologic/Lymphatic: Negative for bleeding problem.  Skin: Negative for rash.  Musculoskeletal: Positive for arthritis. Negative for falls and myalgias.  Gastrointestinal: Negative for abdominal pain, hematochezia and melena.  Genitourinary: Negative for hematuria.  Neurological: Negative for excessive daytime sleepiness and sensory change.   EKGs/Labs/Other Studies Reviewed:    The following studies were reviewed today:  prior ECGs  EKG:  EKG is ordered today.  The ekg ordered today demonstrates normal sinus rhythm with 1st degree AV block.  Recent Labs: 02/21/2018: ALT 23; BUN 18; Creatinine, Ser 1.01; Hemoglobin 15.3; Platelets 309.0; Potassium 4.3; Sodium 136  Recent Lipid Panel    Component Value Date/Time   CHOL 221 (H) 12/17/2016 0908   TRIG 294.0 (H) 12/17/2016 0908   HDL 41.30 12/17/2016 0908   CHOLHDL 5 12/17/2016 0908   VLDL 58.8 (H) 12/17/2016 0908   LDLCALC 102 (H) 07/16/2008 0923   LDLDIRECT 120.0 02/21/2018 1225    Physical Exam:    VS:  BP 130/88 (BP Location: Right Arm)   Pulse 80   Ht 6\' 7"  (2.007 m)   Wt 283 lb 12.8 oz (128.7 kg)   BMI 31.97 kg/m     Wt Readings from Last 3 Encounters:  03/21/18 283 lb 12.8 oz (128.7 kg)  02/21/18 287 lb 12.8 oz (130.5 kg)  01/24/18 290 lb 3.2 oz (131.6 kg)    GEN: Well nourished, well developed in no acute distress HEENT: Normal NECK: No JVD; No carotid bruits LYMPHATICS: No lymphadenopathy CARDIAC: regular rhythm, normal S1 and S2, no murmurs, rubs, gallops. Radial and DP pulses 2+ bilaterally. RESPIRATORY:  Clear to auscultation without rales, wheezing or rhonchi  ABDOMEN: Soft, non-tender, non-distended MUSCULOSKELETAL:  No edema;  No deformity  SKIN: Warm and dry NEUROLOGIC:  Alert and oriented x 3 PSYCHIATRIC:  Normal affect   ASSESSMENT:    1. Palpitations   2. Heart palpitations   3. Essential hypertension   4. Hyperlipidemia, unspecified hyperlipidemia type   5. Class 1 obesity due to excess calories with body mass index (BMI) of 32.0 to 32.9 in adult, unspecified whether serious comorbidity present   6. Counseling on health promotion and disease prevention   7. Tobacco abuse counseling   8. Resistant hypertension   9. Controlled type 2 diabetes mellitus without complication, without long-term current use of insulin ([place])   10. Smoker   11. Erectile dysfunction due to diseases classified elsewhere    PLAN:    1. Palpitations: -have not occurred in the last several months. Would be difficult to capture on a monitor if he has not had recent occurrences. As his associated symptoms occur only with the palpitations, and he has no symptoms of chest pain or shortness of breath at rest or with exertion, we discussed evaluation options. Low suspicion for coronary ischemia given symptoms. Did discuss treadmill stress w/imaging as well as monitor now versus him watching for symptoms and contacting us if they recur. At this time, he prefers not to proceed with monitor or additional testing due to his current lack of symptoms. Instructed him to call or message [place] if the symptoms recur, as we could then set him up for a monitor and/or testing depending on the symptoms. He is amenable to this.  2. Cardiovascular risk factors: counseled on optimization of cardiac risk factors.  -HTN: has had resistant hypertension since a young age. Runs in his family. Workup done by Dr. Yong Channel for causes of resistant hypertension. Currently on amlodipine 10 mg, chlorthalidone 25 mg, lisinopril 40 mg, bystolic 10 mg, and spironolactone 25 mg. Repeat reading improved today. He will continue to follow w/PCP -HLD: had issues with simvastatin in the  past. Now tolerating atorvastatin weekly. Recommend gradual increase in frequency as tolerated. -Obesity: counseled on diet and exercise. -Tobacco: The patient was counseled on the dangers of tobacco use, and was advised  to quit.  Reviewed strategies to maximize success, including removing cigarettes and smoking materials from environment, stress management and substitution of other forms of reinforcement. Offered assistance, declined. -DM: diet controlled currently, last A1c 6.5 -prevention: ASA recommendations in flux, discussed with patient, he will continue at this time.  3. ED: clear to try medications from a cardiac standpoint. Did counsel that if he is ever seen by EMS/ER, he should let them know if he is taking a PDE inhibitor as he should not receive nitroglycerin. He verbalized understanding.  Plan for follow up: PRN  Medication Adjustments/Labs and Tests Ordered: Current medicines are reviewed at length with the patient today.  Concerns regarding medicines are outlined above.  Orders Placed This Encounter  Procedures  . EKG 12-Lead   No orders of the defined types were placed in this encounter.   Patient Instructions  Medication Instructions: Your physician recommends that you continue on your current medications as directed.    If you need a refill on your cardiac medications before your next appointment, please call your pharmacy.   Labwork: None  Procedures/Testing: None  Follow-Up: Your physician wants you to follow-up as needed with Dr. Harrell Gave. Call our office at [phone] to schedule appointment.   Special Instructions:    Thank you for choosing Heartcare at [facility]!!       Signed, Buford Dresser, MD PhD 03/21/2018 11:39 AM    [place]

## 2018-03-27 MED FILL — CHLORTHALIDONE 25 MG TABS: 25 | 30 days supply | Qty: 30 | Fill #2

## 2018-04-21 MED FILL — ATORVASTATIN CALCIUM 20 MG: 20 | 90 days supply | Qty: 13 | Fill #1

## 2018-04-23 MED FILL — BYSTOLIC 10 MG TABLET: 10 | 90 days supply | Qty: 90 | Fill #1

## 2018-04-23 MED FILL — CHLORTHALIDONE 25 MG TABS: 25 | 30 days supply | Qty: 30 | Fill #3

## 2018-05-02 IMAGING — CT CT ANGIO ABDOMEN
3 of 10 series · 12 of 46 positions shown, 18 images · IV contrast (isovue)
Comparison: None.

CLINICAL DATA: Resistant hypertension.

EXAM:
CT ANGIOGRAPHY ABDOMEN
TECHNIQUE: Multidetector CT imaging of the abdomen was performed using the
standard protocol during bolus administration of intravenous
contrast. Multiplanar reconstructed images and MIPs were obtained
and reviewed to evaluate the vascular anatomy.
CONTRAST:  100 cc Isovue 370 intravenous

[Series 4: arterial 3.0 i40f 2 · axial · arterial · 0.76mm/px · z∈[-299,-218]mm · 3 of 108 slices shown]
[im 9/108  soft-tissue]
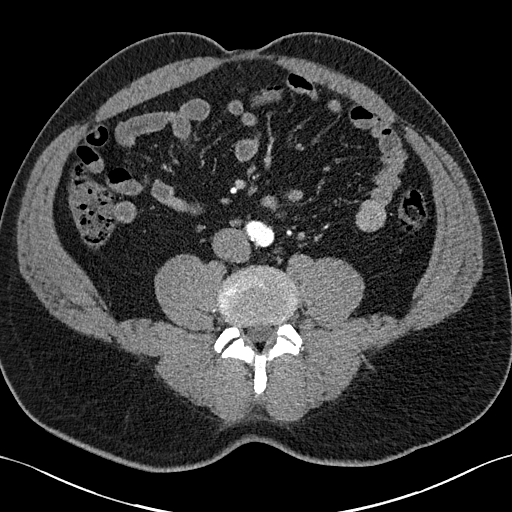
[im 27/108  soft-tissue]
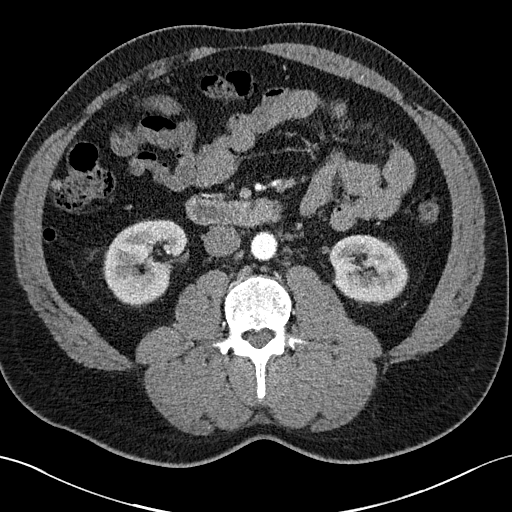
[im 36/108  soft-tissue]
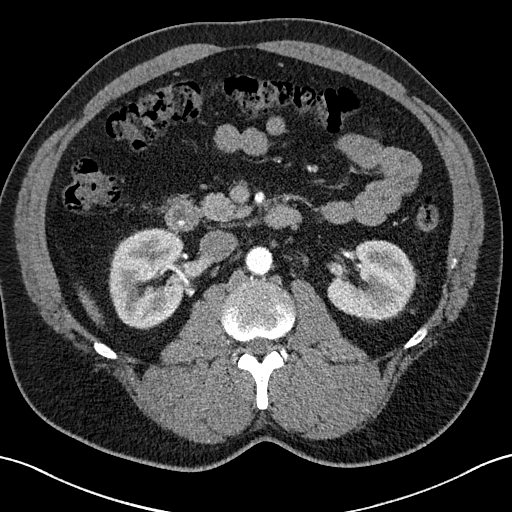

[Series 7: venous 5.0 i40f 2 · axial · portal-venous · 0.76mm/px · z∈[-283,-43]mm · 7 of 65 slices shown, 12 images]
[im 9/65  soft-tissue]
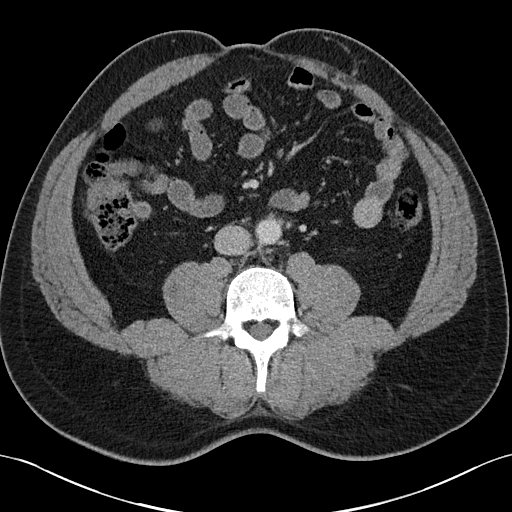
[im 9/65  bone]
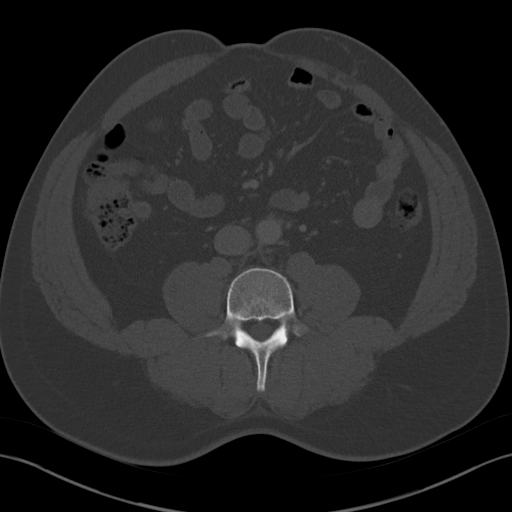
[im 17/65  soft-tissue]
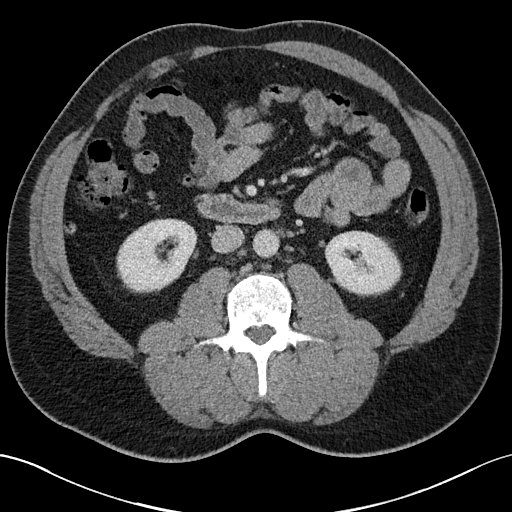
[im 25/65  soft-tissue]
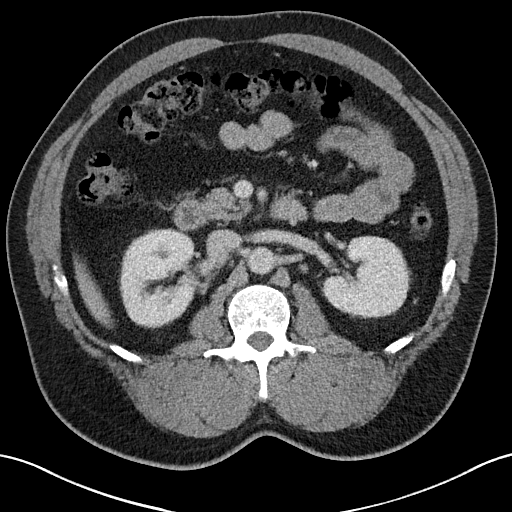
[im 33/65  soft-tissue]
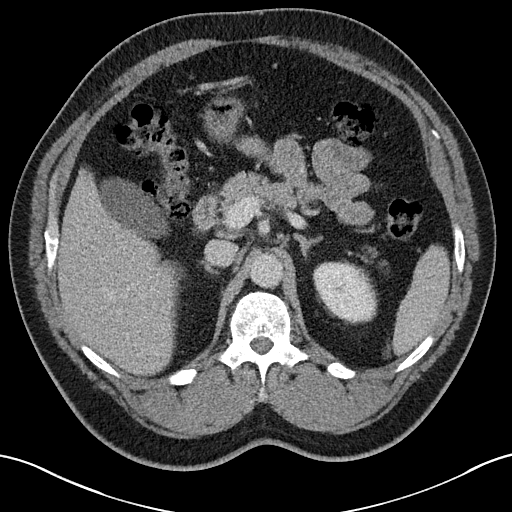
[im 33/65  lung]
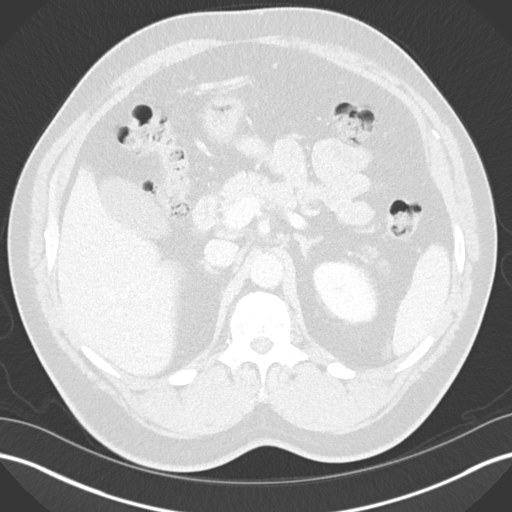
[im 41/65  soft-tissue]
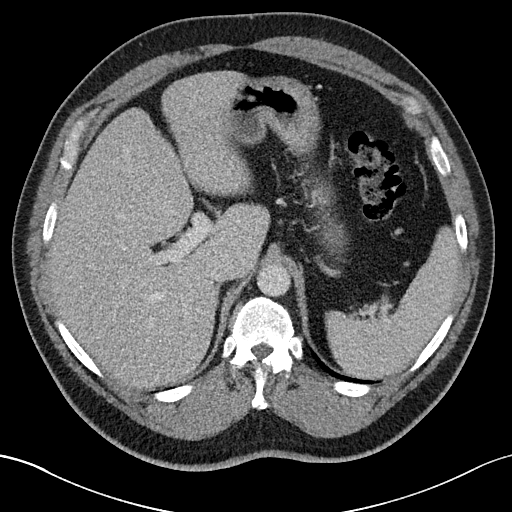
[im 41/65  lung]
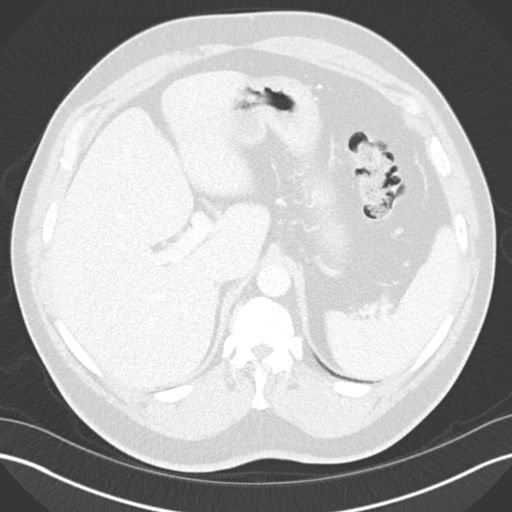
[im 49/65  soft-tissue]
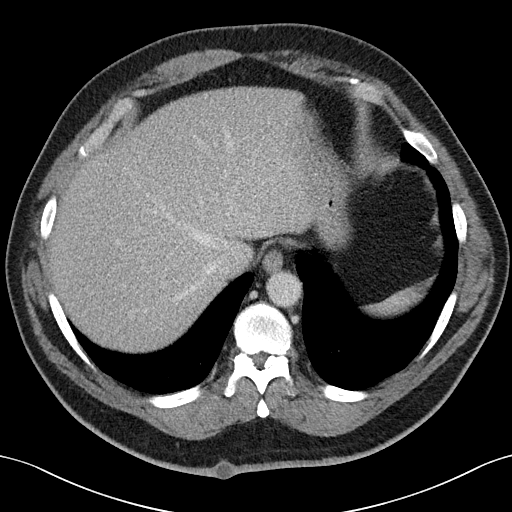
[im 49/65  lung]
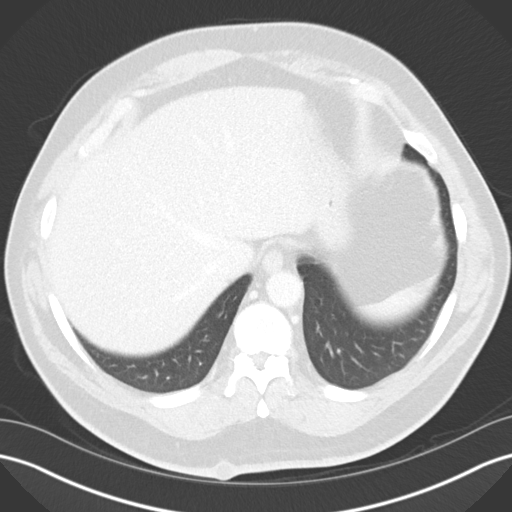
[im 57/65  soft-tissue]
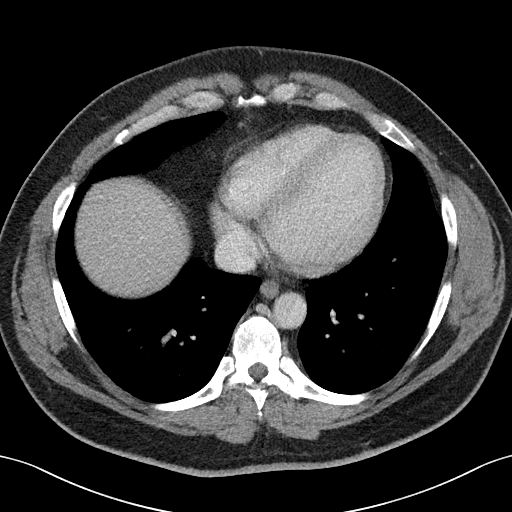
[im 57/65  lung]
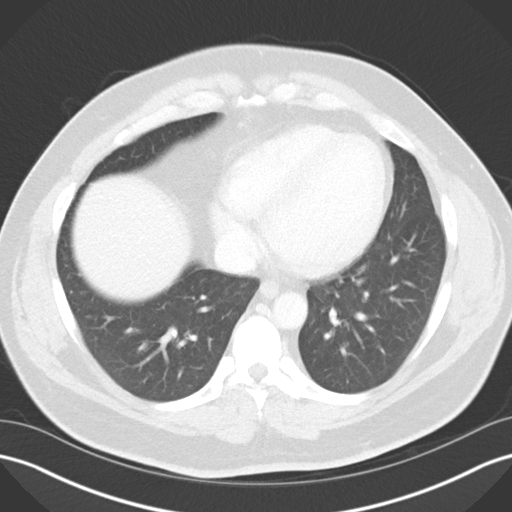

[Series 10: coronals · coronal · 0.65mm/px · 2 of 165 slices shown, 3 images]
[im 55/165  soft-tissue]
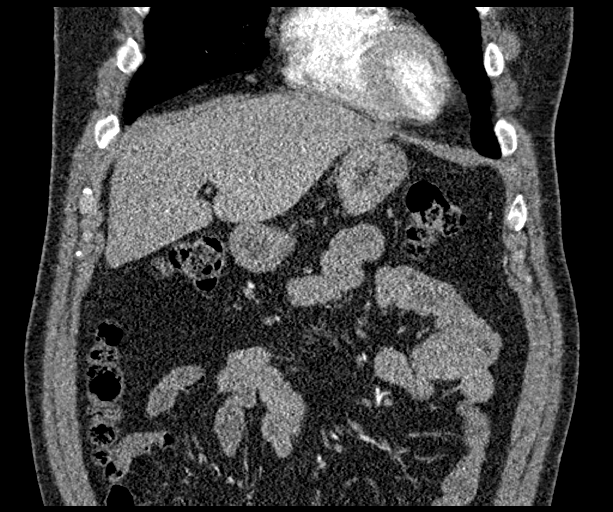
[im 55/165  bone]
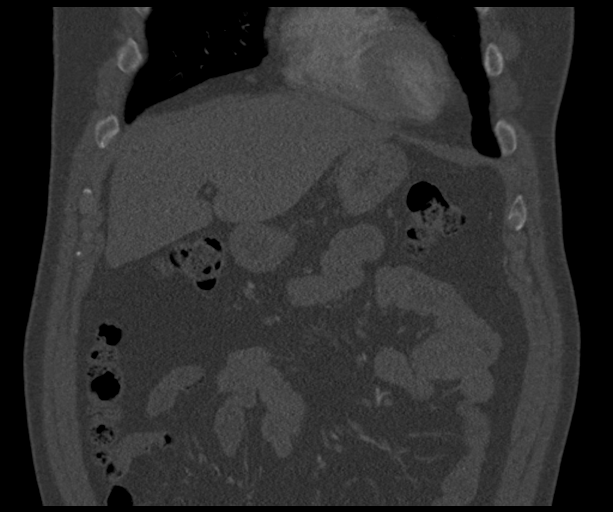
[im 110/165  soft-tissue]
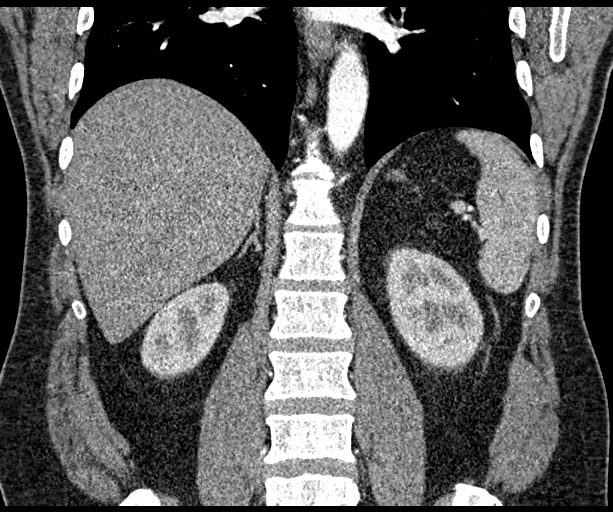

[12 of 46 positions shown; findings below may reference images not displayed]

FINDINGS: VASCULAR

Aorta: Normal caliber aorta without aneurysm, dissection, vasculitis
or significant stenosis. Mild atherosclerotic plaque at the aortic
bifurcation and in the proximal common iliac artery spot

Celiac: Patent without evidence of aneurysm, dissection, vasculitis
or significant stenosis.

SMA: Patent without evidence of aneurysm, dissection, vasculitis or
significant stenosis.

Renals: Solitary bilateral renal arteries. Vessels are smooth and
widely patent. No atheromatous changes or evidence of fibromuscular
dysplasia. Negative for aneurysm.

IMA: Unremarkable where partly visualized.

Veins: The renal veins are well opacified and symmetric. No venous
abnormality noted.

Review of the MIP images confirms the above findings.

NON-VASCULAR

Lower chest:  No contributory findings.

Hepatobiliary: No focal liver abnormality.No evidence of biliary
obstruction or stone.

Pancreas: Unremarkable.

Spleen: Unremarkable.

Adrenals/Urinary Tract: Negative adrenals. No hydronephrosis or
stone. Normal renal size. No scarring or mass lesion.

Stomach/Bowel:  No obstruction. Negative appendix.

Lymphatic:   No mass or adenopathy.

Other: No ascites or pneumoperitoneum. Dermal inclusion cyst in the
midline upper back, measuring up to 2 cm. No superimposed
inflammation.

Musculoskeletal: No acute abnormalities.
IMPRESSION: VASCULAR

1. Normal CTA of the renal arteries. No stenosis, atherosclerosis,
or evidence of vasculopathy.
2. Mild atherosclerosis at the aortic bifurcation.

NON-VASCULAR

Normal appearance of the kidneys and adrenals.

## 2018-05-07 MED FILL — LISINOPRIL 40 MG TABLET: 40 | 90 days supply | Qty: 90 | Fill #1

## 2018-05-16 ENCOUNTER — Other Ambulatory Visit: Payer: Self-pay | Admitting: Family Medicine

## 2018-05-16 MED FILL — SPIRONOLACTONE 25 MG TABS: 25 | 30 days supply | Qty: 30 | Fill #0

## 2018-05-29 MED FILL — CHLORTHALIDONE 25 MG TABS: 25 | 30 days supply | Qty: 30 | Fill #4

## 2018-06-06 ENCOUNTER — Ambulatory Visit: Payer: No Typology Code available for payment source | Admitting: Family Medicine

## 2018-06-27 MED FILL — AMLODIPINE BESYLATE 10 MG T: 10 | 90 days supply | Qty: 90 | Fill #1

## 2018-07-01 MED FILL — CHLORTHALIDONE 25 MG TABS: 25 | 30 days supply | Qty: 30 | Fill #5

## 2018-07-10 ENCOUNTER — Ambulatory Visit (INDEPENDENT_AMBULATORY_CARE_PROVIDER_SITE_OTHER): Payer: No Typology Code available for payment source | Admitting: Family Medicine

## 2018-07-10 ENCOUNTER — Encounter: Payer: Self-pay | Admitting: Family Medicine

## 2018-07-10 VITALS — BP 138/79 | HR 80 | Temp 97.4°F | Ht 79.0 in | Wt 283.0 lb

## 2018-07-10 DIAGNOSIS — N529 Male erectile dysfunction, unspecified: Secondary | ICD-10-CM | POA: Insufficient documentation

## 2018-07-10 DIAGNOSIS — I10 Essential (primary) hypertension: Secondary | ICD-10-CM

## 2018-07-10 DIAGNOSIS — E785 Hyperlipidemia, unspecified: Secondary | ICD-10-CM

## 2018-07-10 DIAGNOSIS — R002 Palpitations: Secondary | ICD-10-CM

## 2018-07-10 DIAGNOSIS — E119 Type 2 diabetes mellitus without complications: Secondary | ICD-10-CM

## 2018-07-10 DIAGNOSIS — F17200 Nicotine dependence, unspecified, uncomplicated: Secondary | ICD-10-CM

## 2018-07-10 DIAGNOSIS — L732 Hidradenitis suppurativa: Secondary | ICD-10-CM

## 2018-07-10 DIAGNOSIS — N5201 Erectile dysfunction due to arterial insufficiency: Secondary | ICD-10-CM | POA: Diagnosis not present

## 2018-07-10 LAB — COMPREHENSIVE METABOLIC PANEL
ALK PHOS: 90 U/L (ref 39–117)
ALT: 25 U/L (ref 0–53)
AST: 22 U/L (ref 0–37)
Albumin: 4.6 g/dL (ref 3.5–5.2)
BILIRUBIN TOTAL: 0.6 mg/dL (ref 0.2–1.2)
BUN: 17 mg/dL (ref 6–23)
CALCIUM: 10 mg/dL (ref 8.4–10.5)
CO2: 28 mEq/L (ref 19–32)
Chloride: 103 mEq/L (ref 96–112)
Creatinine, Ser: 1.03 mg/dL (ref 0.40–1.50)
GFR: 98.58 mL/min (ref >60.00)
GLUCOSE: 115 mg/dL — AB (ref 70–99)
Potassium: 4.1 mEq/L (ref 3.5–5.1)
Sodium: 136 mEq/L (ref 135–145)
TOTAL PROTEIN: 7.6 g/dL (ref 6.0–8.3)

## 2018-07-10 LAB — LDL CHOLESTEROL, DIRECT: Direct LDL: 118 mg/dL

## 2018-07-10 LAB — HEMOGLOBIN A1C: Hgb A1c MFr Bld: 6.4 % (ref 4.6–6.5)

## 2018-07-10 MED ORDER — SILDENAFIL CITRATE 20 MG PO TABS: ORAL_TABLET | ORAL | 3 refills | Status: DC

## 2018-07-10 MED FILL — SILDENAFIL CITRATE 20 MG TA: 20 | 20 days supply | Qty: 50 | Fill #0

## 2018-07-10 NOTE — Assessment & Plan Note (Signed)
S: Patient is still smoking 3 cigarettes on week days and 7 on off days. A/P: He may trial Juul and then transition off- glad he is trying to make progress here but encouraged him to not make Juul a long term solution

## 2018-07-10 NOTE — Assessment & Plan Note (Signed)
S: controlled on chlorthalidone 25 mg, amlodipine 10 mg, Bystolic 10 mg, spironolactone 12.5 mg, lisinopril 40 mg. BP Readings from Last 3 Encounters:  07/10/18 138/79  03/21/18 130/88  02/21/18 132/82  A/P: We discussed blood pressure goal of <140/90. Continue current meds

## 2018-07-10 NOTE — Assessment & Plan Note (Signed)
S: poorly controlled on atorvastatin 20mg  once a week Lab Results  Component Value Date   CHOL 221 (H) 12/17/2016   HDL 41.30 12/17/2016   LDLCALC 102 (H) 07/16/2008   LDLDIRECT 118.0 07/10/2018   TRIG 294.0 (H) 12/17/2016   CHOLHDL 5 12/17/2016   A/P: will increase atorvastatin to 20 mg twice a week and target LDL under 100

## 2018-07-10 NOTE — Patient Instructions (Addendum)
Please stop by lab before you go  Would reach out to cardiology for their suggestion given recurrence of the heart racing  May increase atorvastatin to twice a week if bad cholesterol is still over 100  Trial sildenafil but remember you cannot take nitroglycerin if EMS ever has to come help you as can drop blood pressure

## 2018-07-10 NOTE — Assessment & Plan Note (Signed)
S:heart racing and SOB for 5-10 minutes [place]. No pain. Just this past satudray. Saw Dr. Harrell Gave of cardiology who recommended follow up if he had recurrence  From her note "Palpitations: -have not occurred in the last several months. Would be difficult to capture on a monitor if he has not had recent occurrences. As his associated symptoms occur only with the palpitations, and he has no symptoms of chest pain or shortness of breath at rest or with exertion, we discussed evaluation options. Low suspicion for coronary ischemia given symptoms. Did discuss treadmill stress w/imaging as well as monitor now versus him watching for symptoms and contacting us if they recur. At this time, he prefers not to proceed with monitor or additional testing due to his current lack of symptoms. Instructed him to call or message [place] if the symptoms recur, as we could then set him up for a monitor and/or testing depending on the symptoms. He is amenable to this." A/P: patient did have recurrence recently- encouraged him to reach out to cardiology. May be difficult to capture with monitor given infrequent nature.

## 2018-07-10 NOTE — Assessment & Plan Note (Signed)
S: from cardiology "ED: clear to try medications from a cardiac standpoint. Did counsel that if he is ever seen by EMS/ER, he should let them know if he is taking a PDE inhibitor as he should not receive nitroglycerin. He verbalized understanding."  He continues to have ED issues and asks about starting medication A/P: we discussed trial of sildenafil as below- reinforced not mixing PDE inhibitor and nitroglycerin

## 2018-07-10 NOTE — Assessment & Plan Note (Signed)
S:Trial topical clindamycin last visit for hidradenitis suppurativa- not sure if it helps but has tended to wait until boils get large. Also likely will not work on his sebaceous cysts and abscesses A/P:  Stable recently- encouraged sooner use for small areas

## 2018-07-10 NOTE — Assessment & Plan Note (Addendum)
S: Diet controlled . Has been losing some weight by watching carbs Lab Results  Component Value Date   HGBA1C 6.4 07/10/2018   HGBA1C 6.5 (A) 01/24/2018   HGBA1C 6.4 04/04/2017   A/P: excellent control/ stable-remain off medication

## 2018-07-10 NOTE — Progress Notes (Signed)
Subjective:  Carlos Patterson is a 47 y.o. year old very pleasant male patient who presents for/with See problem oriented charting ROS- No chest pain or shortness of breath (outside of palpitation episode). No headache or blurry vision.    Past Medical History-  Patient Active Problem List   Diagnosis Date Noted  . Smoker 10/02/2016    Priority: High  . Diabetes mellitus type II, controlled ([place]) 05/14/2008    Priority: High  . Resistant hypertension 04/29/2007    Priority: High  . Palpitations 01/24/2018    Priority: Medium  . Hyperlipidemia 06/17/2007    Priority: Medium  . Sebaceous cyst 11/28/2007    Priority: Low  . Erectile dysfunction 07/10/2018  . Hidradenitis suppurativa 02/21/2018  . Tinea versicolor 03/20/2017    Medications- reviewed and updated Current Outpatient Medications  Medication Sig Dispense Refill  . amLODipine (NORVASC) 10 MG tablet TAKE 1 TABLET BY MOUTH ONCE DAILY 90 tablet 3  . aspirin EC 81 MG tablet Take 81 mg by mouth daily.    Marland Kitchen atorvastatin (LIPITOR) 20 MG tablet Take 1 tablet (20 mg total) by mouth once a week. 13 tablet 1  . BYSTOLIC 10 MG tablet TAKE 1 TABLET BY MOUTH ONCE DAILY 90 tablet 3  . chlorthalidone (HYGROTON) 25 MG tablet TAKE 1 TABLET BY MOUTH ONCE DAILY 30 tablet 5  . ciclopirox (LOPROX) 0.77 % cream Apply 1 application topically 2 (two) times daily. 90 g 1  . clindamycin (CLINDAGEL) 1 % gel Apply topically 2 (two) times daily. To boils/pimples in axilla and groin for up to 7 days 60 g 2  . ketoconazole (NIZORAL) 2 % cream Apply 1 application topically daily. For tinea versicolor 60 g 1  . lisinopril (PRINIVIL,ZESTRIL) 40 MG tablet Take 1 tablet (40 mg total) by mouth daily. 90 tablet 3  . spironolactone (ALDACTONE) 25 MG tablet TAKE 1/2 TO 1 TABLET BY MOUTH DAILY 30 tablet 5  . sildenafil (REVATIO) 20 MG tablet Take 2-5 tablets as needed once every 48 hours for erectile dysfunction 50 tablet 3   No current facility-administered  medications for this visit.     Objective: BP 138/79 (BP Location: Left Arm, Patient Position: Sitting, Cuff Size: Large)   Pulse 80   Temp (!) 97.4 F (36.3 C) (Oral)   Ht 6\' 7"  (2.007 m)   Wt 283 lb (128.4 kg)   SpO2 97%   BMI 31.88 kg/m  Gen: NAD, resting comfortably CV: RRR no murmurs rubs or gallops Lungs: CTAB no crackles, wheeze, rhonchi Abdomen: soft/nontender/nondistended-obese Ext: no edema Skin: warm, dry Neuro: speech normal, moves all extremities  Assessment/Plan:  Resistant hypertension S: controlled on chlorthalidone 25 mg, amlodipine 10 mg, Bystolic 10 mg, spironolactone 12.5 mg, lisinopril 40 mg. BP Readings from Last 3 Encounters:  07/10/18 138/79  03/21/18 130/88  02/21/18 132/82  A/P: We discussed blood pressure goal of <140/90. Continue current meds  Diabetes mellitus type II, controlled ([place]) S: Diet controlled . Has been losing some weight by watching carbs Lab Results  Component Value Date   HGBA1C 6.4 07/10/2018   HGBA1C 6.5 (A) 01/24/2018   HGBA1C 6.4 04/04/2017   A/P: excellent control/ stable-remain off medication  Smoker S: Patient is still smoking 3 cigarettes on week days and 7 on off days. A/P: He may trial Juul and then transition off- glad he is trying to make progress here but encouraged him to not make Juul a long term solution  Hidradenitis suppurativa S:Trial topical clindamycin last  visit for hidradenitis suppurativa- not sure if it helps but has tended to wait until boils get large. Also likely will not work on his sebaceous cysts and abscesses A/P:  Stable recently- encouraged sooner use for small areas   Erectile dysfunction S: from cardiology "ED: clear to try medications from a cardiac standpoint. Did counsel that if he is ever seen by EMS/ER, he should let them know if he is taking a PDE inhibitor as he should not receive nitroglycerin. He verbalized understanding."  He continues to have ED issues and asks about starting  medication A/P: we discussed trial of sildenafil as below- reinforced not mixing PDE inhibitor and nitroglycerin   Hyperlipidemia S: poorly controlled on atorvastatin 20mg  once a week Lab Results  Component Value Date   CHOL 221 (H) 12/17/2016   HDL 41.30 12/17/2016   LDLCALC 102 (H) 07/16/2008   LDLDIRECT 118.0 07/10/2018   TRIG 294.0 (H) 12/17/2016   CHOLHDL 5 12/17/2016   A/P: will increase atorvastatin to 20 mg twice a week and target LDL under 100    Palpitations S:heart racing and SOB for 5-10 minutes [place]. No pain. Just this past satudray. Saw Dr. Harrell Gave of cardiology who recommended follow up if he had recurrence  From her note "Palpitations: -have not occurred in the last several months. Would be difficult to capture on a monitor if he has not had recent occurrences. As his associated symptoms occur only with the palpitations, and he has no symptoms of chest pain or shortness of breath at rest or with exertion, we discussed evaluation options. Low suspicion for coronary ischemia given symptoms. Did discuss treadmill stress w/imaging as well as monitor now versus him watching for symptoms and contacting us if they recur. At this time, he prefers not to proceed with monitor or additional testing due to his current lack of symptoms. Instructed him to call or message [place] if the symptoms recur, as we could then set him up for a monitor and/or testing depending on the symptoms. He is amenable to this." A/P: patient did have recurrence recently- encouraged him to reach out to cardiology. May be difficult to capture with monitor given infrequent nature.    Lab/Order associations: Controlled type 2 diabetes mellitus without complication, without long-term current use of insulin ([place]) - Plan: Comprehensive metabolic panel, Hemoglobin A1c, LDL cholesterol, direct  Meds ordered this encounter  Medications  . sildenafil (REVATIO) 20 MG tablet    Sig: Take 2-5  tablets as needed once every 48 hours for erectile dysfunction    Dispense:  50 tablet    Refill:  3    Return precautions advised.  Garret Reddish, MD

## 2018-07-11 ENCOUNTER — Other Ambulatory Visit: Payer: Self-pay

## 2018-07-11 MED ORDER — ATORVASTATIN CALCIUM 20 MG PO TABS: 20.0000 mg | ORAL_TABLET | ORAL | 3 refills | Status: DC

## 2018-07-11 MED FILL — ATORVASTATIN CALCIUM 20 MG: 20 | 84 days supply | Qty: 24 | Fill #0

## 2018-07-16 MED FILL — SPIRONOLACTONE 25 MG TABS: 25 | 30 days supply | Qty: 30 | Fill #1

## 2018-07-21 ENCOUNTER — Encounter: Payer: Self-pay | Admitting: Family Medicine

## 2018-08-04 MED FILL — BYSTOLIC 10 MG TABLET: 10 | 90 days supply | Qty: 90 | Fill #2

## 2018-08-05 ENCOUNTER — Other Ambulatory Visit: Payer: Self-pay | Admitting: Family Medicine

## 2018-08-05 MED FILL — CHLORTHALIDONE 25 MG TABS: 25 | 30 days supply | Qty: 30 | Fill #0

## 2018-08-12 ENCOUNTER — Telehealth: Payer: Self-pay

## 2018-08-12 NOTE — Telephone Encounter (Signed)
PA initiated via [email or web address]  Next Steps  The plan will fax you a determination, typically within 1 to 5 business days.

## 2018-08-12 NOTE — Telephone Encounter (Signed)
Sildenafil Citrate 20 mg tab, take 2-5 tab po q48h prn for ED [place] Member ID [id number]

## 2018-08-13 MED FILL — SILDENAFIL CITRATE 20 MG TA: 20 | 20 days supply | Qty: 50 | Fill #1

## 2018-08-13 MED FILL — LISINOPRIL 40 MG TABLET: 40 | 90 days supply | Qty: 90 | Fill #2

## 2018-08-21 NOTE — Telephone Encounter (Signed)
Noted  

## 2018-09-04 MED FILL — CHLORTHALIDONE 25 MG TABS: 25 | 30 days supply | Qty: 30 | Fill #1

## 2018-09-15 LAB — HM DIABETES EYE EXAM

## 2018-09-19 ENCOUNTER — Encounter: Payer: Self-pay | Admitting: Family Medicine

## 2018-09-23 MED FILL — SPIRONOLACTONE 25 MG TABS: 25 | 30 days supply | Qty: 30 | Fill #2

## 2018-09-30 MED FILL — AMLODIPINE BESYLATE 10 MG T: 10 | 90 days supply | Qty: 90 | Fill #2

## 2018-09-30 MED FILL — CHLORTHALIDONE 25 MG TABS: 25 | 30 days supply | Qty: 30 | Fill #2

## 2018-09-30 MED FILL — ATORVASTATIN CALCIUM 20 MG: 20 | 84 days supply | Qty: 24 | Fill #1

## 2018-10-09 MED FILL — SILDENAFIL CITRATE 20 MG TA: 20 | 20 days supply | Qty: 50 | Fill #2

## 2018-11-07 MED FILL — CHLORTHALIDONE 25 MG TABS: 25 | 30 days supply | Qty: 30 | Fill #3

## 2018-11-07 MED FILL — BYSTOLIC 10 MG TABLET: 10 | 30 days supply | Qty: 30 | Fill #3

## 2018-11-20 MED FILL — LISINOPRIL 40 MG TABLET: 40 | 90 days supply | Qty: 90 | Fill #3

## 2018-11-20 MED FILL — SILDENAFIL CITRATE 20 MG TA: 20 | 20 days supply | Qty: 50 | Fill #3

## 2018-12-11 MED FILL — BYSTOLIC 10 MG TABLET: 10 | 30 days supply | Qty: 30 | Fill #4

## 2018-12-11 MED FILL — CHLORTHALIDONE 25 MG TABS: 25 | 30 days supply | Qty: 30 | Fill #4

## 2018-12-11 MED FILL — SPIRONOLACTONE 25 MG TABS: 25 | 30 days supply | Qty: 30 | Fill #3

## 2019-01-12 MED FILL — BYSTOLIC 10 MG TABLET: 10 | 30 days supply | Qty: 30 | Fill #5

## 2019-01-12 MED FILL — AMLODIPINE BESYLATE 10 MG T: 10 | 90 days supply | Qty: 90 | Fill #3

## 2019-01-12 MED FILL — ATORVASTATIN 20 MG TABLET: 20 | 84 days supply | Qty: 24 | Fill #2

## 2019-01-12 MED FILL — CHLORTHALIDONE 25 MG TABLET: 25 | 30 days supply | Qty: 30 | Fill #5

## 2019-01-19 ENCOUNTER — Other Ambulatory Visit: Payer: Self-pay | Admitting: Family Medicine

## 2019-02-02 MED FILL — SILDENAFIL CITRATE 20 MG TA: 20 | 20 days supply | Qty: 50 | Fill #0

## 2019-02-16 ENCOUNTER — Other Ambulatory Visit: Payer: Self-pay | Admitting: Family Medicine

## 2019-02-16 MED FILL — SPIRONOLACTONE 25 MG TABS: 25 | 30 days supply | Qty: 30 | Fill #4

## 2019-02-16 NOTE — Telephone Encounter (Signed)
Last OV 07/10/18 Last refill Chlorthalidone 08/05/18 #30/5                 Bystolic 9/43/20 #03/7 Next OV not scheduled  Need follow-up visit

## 2019-02-18 MED FILL — BYSTOLIC 10 MG TABLET: 10 | 30 days supply | Qty: 30 | Fill #0

## 2019-02-18 MED FILL — CHLORTHALIDONE 25 MG TABLET: 25 | 30 days supply | Qty: 30 | Fill #0

## 2019-02-18 NOTE — Telephone Encounter (Signed)
Called pt and left VM to call the office.   Please contact pt to schedule med f/u visit.

## 2019-02-20 NOTE — Telephone Encounter (Signed)
Called pt on 02/20/19 to schedule for med f/u appt. No answer, LVM.

## 2019-03-03 ENCOUNTER — Other Ambulatory Visit: Payer: Self-pay

## 2019-03-03 ENCOUNTER — Ambulatory Visit (INDEPENDENT_AMBULATORY_CARE_PROVIDER_SITE_OTHER): Payer: No Typology Code available for payment source | Admitting: Family Medicine

## 2019-03-03 ENCOUNTER — Encounter: Payer: Self-pay | Admitting: Family Medicine

## 2019-03-03 VITALS — BP 132/82 | HR 73 | Temp 98.4°F | Ht 79.0 in | Wt 272.4 lb

## 2019-03-03 DIAGNOSIS — Z0000 Encounter for general adult medical examination without abnormal findings: Secondary | ICD-10-CM

## 2019-03-03 DIAGNOSIS — E119 Type 2 diabetes mellitus without complications: Secondary | ICD-10-CM | POA: Diagnosis not present

## 2019-03-03 DIAGNOSIS — E1159 Type 2 diabetes mellitus with other circulatory complications: Secondary | ICD-10-CM | POA: Diagnosis not present

## 2019-03-03 DIAGNOSIS — Z1211 Encounter for screening for malignant neoplasm of colon: Secondary | ICD-10-CM | POA: Diagnosis not present

## 2019-03-03 DIAGNOSIS — E1169 Type 2 diabetes mellitus with other specified complication: Secondary | ICD-10-CM

## 2019-03-03 DIAGNOSIS — E785 Hyperlipidemia, unspecified: Secondary | ICD-10-CM | POA: Diagnosis not present

## 2019-03-03 DIAGNOSIS — Z125 Encounter for screening for malignant neoplasm of prostate: Secondary | ICD-10-CM | POA: Diagnosis not present

## 2019-03-03 DIAGNOSIS — F17200 Nicotine dependence, unspecified, uncomplicated: Secondary | ICD-10-CM

## 2019-03-03 DIAGNOSIS — E669 Obesity, unspecified: Secondary | ICD-10-CM

## 2019-03-03 DIAGNOSIS — I152 Hypertension secondary to endocrine disorders: Secondary | ICD-10-CM

## 2019-03-03 DIAGNOSIS — I10 Essential (primary) hypertension: Secondary | ICD-10-CM

## 2019-03-03 LAB — CBC
HCT: 42.1 % (ref 39.0–52.0)
Hemoglobin: 14.7 g/dL (ref 13.0–17.0)
MCHC: 35 g/dL (ref 30.0–36.0)
MCV: 90.7 fl (ref 78.0–100.0)
Platelets: 296 10*3/uL (ref 150.0–400.0)
RBC: 4.64 Mil/uL (ref 4.22–5.81)
RDW: 13.1 % (ref 11.5–15.5)
WBC: 9.9 10*3/uL (ref 4.0–10.5)

## 2019-03-03 LAB — URINALYSIS
Bilirubin Urine: NEGATIVE
Hgb urine dipstick: NEGATIVE
Ketones, ur: NEGATIVE
Leukocytes,Ua: NEGATIVE
Nitrite: NEGATIVE
Specific Gravity, Urine: 1.02 (ref 1.000–1.030)
Total Protein, Urine: NEGATIVE
Urine Glucose: NEGATIVE
Urobilinogen, UA: 0.2 (ref 0.0–1.0)
pH: 7.5 (ref 5.0–8.0)

## 2019-03-03 LAB — COMPREHENSIVE METABOLIC PANEL
ALT: 29 U/L (ref 0–53)
AST: 28 U/L (ref 0–37)
Albumin: 4.7 g/dL (ref 3.5–5.2)
Alkaline Phosphatase: 97 U/L (ref 39–117)
BUN: 16 mg/dL (ref 6–23)
CO2: 28 mEq/L (ref 19–32)
Calcium: 10.1 mg/dL (ref 8.4–10.5)
Chloride: 99 mEq/L (ref 96–112)
Creatinine, Ser: 1.02 mg/dL (ref 0.40–1.50)
GFR: 93.55 mL/min (ref >60.00)
Glucose, Bld: 103 mg/dL — ABNORMAL HIGH (ref 70–99)
Potassium: 4.2 mEq/L (ref 3.5–5.1)
Sodium: 137 mEq/L (ref 135–145)
Total Bilirubin: 0.9 mg/dL (ref 0.2–1.2)
Total Protein: 7.5 g/dL (ref 6.0–8.3)

## 2019-03-03 LAB — LDL CHOLESTEROL, DIRECT: Direct LDL: 102 mg/dL

## 2019-03-03 LAB — LIPID PANEL
Cholesterol: 173 mg/dL (ref 0–200)
HDL: 46.4 mg/dL (ref >39.00)
NonHDL: 126.4
Total CHOL/HDL Ratio: 4
Triglycerides: 276 mg/dL — ABNORMAL HIGH (ref 0.0–149.0)
VLDL: 55.2 mg/dL — ABNORMAL HIGH (ref 0.0–40.0)

## 2019-03-03 LAB — HEMOGLOBIN A1C: Hgb A1c MFr Bld: 6.2 % (ref 4.6–6.5)

## 2019-03-03 LAB — PSA: PSA: 1.44 ng/mL (ref 0.10–4.00)

## 2019-03-03 NOTE — Progress Notes (Signed)
Phone: [phone]   Subjective:  Patient presents today for their annual physical. Chief complaint-noted.   See problem oriented charting- ROS- full  review of systems was completed and negative except for: left arm mild paresthesias at times  The following were reviewed and entered/updated in epic: Past Medical History:  Diagnosis Date   Acne vulgaris    Chicken pox    Diabetes mellitus    Hyperlipidemia    Hypertension    Patient Active Problem List   Diagnosis Date Noted   Smoker 10/02/2016    Priority: High   Diabetes mellitus type II, controlled ([place]) 05/14/2008    Priority: High   Resistant hypertension 04/29/2007    Priority: High   Hypertension associated with diabetes ([place]) 03/03/2019    Priority: Medium   Palpitations 01/24/2018    Priority: Medium   Hyperlipidemia associated with type 2 diabetes mellitus ([place]) 06/17/2007    Priority: Medium   Erectile dysfunction 07/10/2018    Priority: Low   Hidradenitis suppurativa 02/21/2018    Priority: Low   Tinea versicolor 03/20/2017    Priority: Low   Sebaceous cyst 11/28/2007    Priority: Low   Past Surgical History:  Procedure Laterality Date   PILONIDAL CYST EXCISION     removal    Family History  Problem Relation Age of Onset   Hyperlipidemia Unknown    Aneurysm Mother        brain. passed 2003. complained of HA one day and passed next day   Diabetes Mother    Other Father        does not talk much- not a good relationship. unknown   CVA Maternal Grandmother        x5   Alcohol abuse Maternal Grandmother    Hypertension Maternal Grandfather     Medications- reviewed and updated Current Outpatient Medications  Medication Sig Dispense Refill   amLODipine (NORVASC) 10 MG tablet TAKE 1 TABLET BY MOUTH ONCE DAILY 90 tablet 3   aspirin EC 81 MG tablet Take 81 mg by mouth daily.     atorvastatin (LIPITOR) 20 MG tablet Take 1 tablet (20 mg total) by mouth 2 (two) times a  week. 26 tablet 3   BYSTOLIC 10 MG tablet TAKE 1 TABLET BY MOUTH ONCE DAILY 30 tablet 0   chlorthalidone (HYGROTON) 25 MG tablet TAKE 1 TABLET BY MOUTH ONCE DAILY 30 tablet 0   ciclopirox (LOPROX) 0.77 % cream Apply 1 application topically 2 (two) times daily. 90 g 1   clindamycin (CLINDAGEL) 1 % gel Apply topically 2 (two) times daily. To boils/pimples in axilla and groin for up to 7 days 60 g 2   ketoconazole (NIZORAL) 2 % cream Apply 1 application topically daily. For tinea versicolor 60 g 1   lisinopril (PRINIVIL,ZESTRIL) 40 MG tablet Take 1 tablet (40 mg total) by mouth daily. 90 tablet 3   sildenafil (REVATIO) 20 MG tablet TAKE 2 TO 5 TABLETS BY MOUTH AS NEEDED ONCE EVERY 48 HOURS FOR ERECTILE DYSFUNCTION 50 tablet 1   spironolactone (ALDACTONE) 25 MG tablet TAKE 1/2 TO 1 TABLET BY MOUTH DAILY 30 tablet 5   No current facility-administered medications for this visit.     Allergies-reviewed and updated No Known Allergies  Social History   Social History Narrative   Married 2000. Step daughter and biological daughter. 23 and 17 in 2018- HS senior [employer].    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    College at [employer] finished.       Hobbies: [place]   Objective  Objective:  BP 132/82 (BP Location: Left Arm, Patient Position: Sitting, Cuff Size: Large)    Pulse 73    Temp 98.4 F (36.9 C) (Oral)    Ht 6\' 7"  (2.007 m)    Wt 272 lb 6.4 oz (123.6 kg)    SpO2 96%    BMI 30.69 kg/m  Gen: NAD, resting comfortably HEENT: Mucous membranes are moist. Oropharynx normal. TM blocked by cerumen on right (advised mineral oil) and TM normal on left Neck: no thyromegaly or cervical lymphadenopathy CV: RRR no murmurs rubs or gallops Lungs: CTAB no crackles, wheeze, rhonchi Abdomen: soft/nontender/nondistended/normal bowel sounds. No rebound or guarding.  Ext: no edema Skin: warm, dry Neuro: grossly normal, moves all  extremities, PERRLA  Diabetic Foot Exam - Simple   Simple Foot Form Diabetic Foot exam was performed with the following findings: Yes 03/03/2019  2:29 PM  Visual Inspection No deformities, no ulcerations, no other skin breakdown bilaterally: Yes Sensation Testing Intact to touch and monofilament testing bilaterally: Yes Pulse Check Posterior Tibialis and Dorsalis pulse intact bilaterally: Yes Comments      Assessment and Plan  48 y.o. male presenting for annual physical.  Health Maintenance counseling: 1. Anticipatory guidance: Patient counseled regarding regular dental exams -q6 months- he needs to schedule this, eye exams -yearly,  avoiding smoking and second hand smoke- current smoker trying to quit , limiting alcohol to 2 beverages per day (hes at 4-5 a day)  2. Risk factor reduction:  Advised patient of need for regular exercise and diet rich and fruits and vegetables to reduce risk of heart attack and stroke. Exercise- 15k steps a day at work- walking 1 hour twice a day when not working. Diet-see below- trying to eat healthy may be meat heavy.  Wt Readings from Last 3 Encounters:  03/03/19 272 lb 6.4 oz (123.6 kg)  07/10/18 283 lb (128.4 kg)  03/21/18 283 lb 12.8 oz (128.7 kg)   3. Immunizations/screenings/ancillary studies Immunization History  Administered Date(s) Administered   Influenza-Unspecified 06/20/2016, 05/07/2018   Pneumococcal Polysaccharide-23 04/29/2017   Tdap 03/20/2017   4. Prostate cancer screening- trend psa today- as long as psa trend low risk can defer rectal exam.   Lab Results  Component Value Date   PSA 1.44 03/03/2019   PSA 1.31 02/21/2018   PSA 1.67 04/04/2017   5. Colon cancer screening - turning 1 soon- will refer for colon cancer screening 6. Skin cancer screening- no dermatologist. advised regular sunscreen use. Denies worrisome, changing, or new skin lesions.  7. current smoker- see below   Status of chronic or acute concerns   #  Diabetes S: Previously controlled with diet CBGs- not checking at home.  Exercise and diet- Trying to limit carb and sugar intake. Exercising/walking about 3 x weekly for 1 hour. Denies hypoglycemic episodes.  Lab Results  Component Value Date   HGBA1C 6.4 07/10/2018   HGBA1C 6.5 (A) 01/24/2018   HGBA1C 6.4 04/04/2017   A/P: Hopefully stable-update A1c today  #Resistant hypertension/hypertension associated with diabetes S: controlled on Spironolactone 25 mg, Lisinopril 40 mg, Amlodipine 10 mg, Bystolic 10 mg, and Chlorthalidone 25 mg. Checks BP at home rarely. No side effects or missed doses. Hard to focus eyes first thing in the morning but clears with day- is going to discuss with eye doctor BP Readings from Last 3 Encounters:  07/10/18  138/79  03/21/18 130/88  02/21/18 132/82  A/P:  Stable. Continue current medications.    #hyperlipidemia associated with diabetes S: Hopefully controlled on Atorvastatin 20 mg twice weekly and Aspirin 81 mg. No side effects, no missed doses. Doing a lot of meats including processed foods- trying to stay away from carbs and getting good veggie intake Lab Results  Component Value Date   CHOL 221 (H) 12/17/2016   HDL 41.30 12/17/2016   LDLCALC 102 (H) 07/16/2008   LDLDIRECT 118.0 07/10/2018   TRIG 294.0 (H) 12/17/2016   CHOLHDL 5 12/17/2016   A/P: Hopefully controlled-we will update full lipid panel today nonfasting Addendum- LDL still above 100-would recommend increasing to 3 times a week-team will reach out to patient  # Numbness and tingling in L arm.  S:Worse while sleeping but happens in the day, otherwise trigger unknown. Like a mild spasm in the left arm but muscle doesn't feel tight- just numb/tingling feeling on upper to lower arm.  Improves with movement. Denies neck pain. N/t radiating into the hands/fingers. Sx x 2 weeks.  Had neck injury in 20s- mild inconvenience A/P: unclear cause- could be coming from neck- mild issues right now- if  worsens we can get neck films and if a1c ok can consider steroid course    # Obesity  S:Congratulated patient on 11 pound weight loss particularly in the midst of covid-19-he attributes this to increased exercise.  Walking 2 a day- hour each time. Plus 15k steps a day at work 3 days a week Wt Readings from Last 3 Encounters:  03/03/19 272 lb 6.4 oz (123.6 kg)  07/10/18 283 lb (128.4 kg)  03/21/18 283 lb 12.8 oz (128.7 kg)  A/P: doing well- Encouraged need for healthy eating, regular exercise, weight loss.    # current smoker S:planned quit date 48th birthday. 1/2 PPD- up from last visit  A/P: congratulated him on setting quit date! We discussed possible nicotine replacement options  Return in about 6 months (around 09/02/2019) for follow up- or sooner if needed.  Lab/Order associations: not fasting    ICD-10-CM   1. Screen for colon cancer  Z12.11 Ambulatory referral to Gastroenterology  2. Preventative health care  Z00.00 CBC    Comprehensive metabolic panel    Lipid panel    PSA    Urinalysis    Hemoglobin A1c    Urinalysis  3. Controlled type 2 diabetes mellitus without complication, without long-term current use of insulin ([facility])  E11.9 CBC    Comprehensive metabolic panel    Lipid panel    Urinalysis    Hemoglobin A1c    Urinalysis  4. Screening for prostate cancer  Z12.5 PSA  5. Hypertension associated with diabetes ([place])  E11.59    I10   6. Hyperlipidemia associated with type 2 diabetes mellitus ([facility])  E11.69    E78.5   7. Smoker  F17.200   8. Resistant hypertension  I10   9. Obesity (BMI 30-39.9)  E66.9    Return precautions advised.  Garret Reddish, MD

## 2019-03-03 NOTE — Patient Instructions (Addendum)
Health Maintenance Due  Topic Date Due  . HEMOGLOBIN A1C - today 01/08/2019  . FOOT EXAM - done today 01/25/2019   Please stop by lab before you go If you do not have mychart- we will call you about results within 5 business days of [place] receiving them.  If you have mychart- we will send your results within 3 business days of [place] receiving them.  If abnormal or we want to clarify a result, we will call or mychart you to make sure you receive the message.  If you have questions or concerns or don't hear within 5-7 days, please send [place] a message or call us.   Completed physical today

## 2019-03-04 ENCOUNTER — Telehealth: Payer: Self-pay

## 2019-03-04 NOTE — Telephone Encounter (Signed)
Yes thanks-but would need a symptom to order under that includes 1 of the many COVID-19 symptoms

## 2019-03-04 NOTE — Telephone Encounter (Signed)
OK to order Covid-19 IgG antibody testing to be done at [facility]?

## 2019-03-04 NOTE — Telephone Encounter (Signed)
Copied from [place] [id number]. Topic: Appointment Scheduling - Scheduling Inquiry for Clinic >> Mar 04, 2019  9:12 AM Rainey Pines A wrote: Patient would like to schedule an appointment for antibody testing and would like a callback.

## 2019-03-05 ENCOUNTER — Other Ambulatory Visit: Payer: Self-pay | Admitting: Family Medicine

## 2019-03-05 MED FILL — LISINOPRIL 40 MG TABLET: 40 | 90 days supply | Qty: 90 | Fill #0

## 2019-03-05 NOTE — Telephone Encounter (Signed)
Called pt and left VM to call the office. Future lab order placed.

## 2019-03-05 NOTE — Addendum Note (Signed)
Addended by: Jasper Loser on: 03/05/2019 10:36 AM   Modules accepted: Orders

## 2019-03-10 MED ORDER — ATORVASTATIN CALCIUM 20 MG PO TABS: 20.0000 mg | ORAL_TABLET | ORAL | 3 refills | Status: DC

## 2019-03-13 NOTE — Telephone Encounter (Signed)
Called pt and left VM to call the office. Need to know which [facility] he wants order sent to and what if any sx he is having or previously had.

## 2019-03-16 MED FILL — SILDENAFIL CITRATE 20 MG TA: 20 | 20 days supply | Qty: 50 | Fill #1

## 2019-03-23 ENCOUNTER — Other Ambulatory Visit: Payer: Self-pay | Admitting: Family Medicine

## 2019-03-23 MED FILL — BYSTOLIC 10 MG TABLET: 10 | 90 days supply | Qty: 90 | Fill #0

## 2019-03-23 MED FILL — ATORVASTATIN 20 MG TABLET: 20 | 84 days supply | Qty: 24 | Fill #3

## 2019-03-23 MED FILL — CHLORTHALIDONE 25 MG TABLET: 25 | 90 days supply | Qty: 90 | Fill #0

## 2019-03-24 MED FILL — SILDENAFIL CITRATE 20 MG TA: 20 | 20 days supply | Qty: 50 | Fill #1

## 2019-03-24 NOTE — Addendum Note (Signed)
Addended by: Jasper Loser on: 03/24/2019 08:48 AM   Modules accepted: Orders

## 2019-04-21 ENCOUNTER — Other Ambulatory Visit: Payer: Self-pay | Admitting: Family Medicine

## 2019-04-22 ENCOUNTER — Other Ambulatory Visit: Payer: Self-pay | Admitting: Family Medicine

## 2019-04-22 MED FILL — SPIRONOLACTONE 25 MG TABS: 25 | 30 days supply | Qty: 30 | Fill #5

## 2019-04-22 MED FILL — AMLODIPINE BESYLATE 10 MG T: 10 | 90 days supply | Qty: 90 | Fill #0

## 2019-05-07 ENCOUNTER — Encounter: Payer: Self-pay | Admitting: Family Medicine

## 2019-05-25 ENCOUNTER — Other Ambulatory Visit: Payer: Self-pay | Admitting: Family Medicine

## 2019-05-26 MED FILL — SILDENAFIL CITRATE 20 MG TA: 20 | 20 days supply | Qty: 50 | Fill #0

## 2019-05-27 ENCOUNTER — Telehealth: Payer: Self-pay | Admitting: Family Medicine

## 2019-05-27 ENCOUNTER — Other Ambulatory Visit: Payer: Self-pay

## 2019-05-27 MED ORDER — ATORVASTATIN CALCIUM 20 MG PO TABS: 20.0000 mg | ORAL_TABLET | ORAL | 3 refills | Status: DC

## 2019-05-27 NOTE — Telephone Encounter (Signed)
See note

## 2019-05-27 NOTE — Telephone Encounter (Signed)
Copied from [place] [id number]. Topic: General - Other >> May 27, 2019  3:47 PM Keene Breath wrote: Reason for CRM: Calling to verify the dosage for patient's medication for atorvastatin (LIPITOR) 20 MG tablet.  Patient stated that it was changed to 3x wk and he is getting low.  Please call to verify.  CB# [phone]

## 2019-05-27 NOTE — Telephone Encounter (Signed)
New rx sent to [facility] outpatient pharmacy for Atorvastatin 20 mg to take three times weekly #40/3.

## 2019-05-28 MED FILL — ATORVASTATIN 20 MG TABLET: 20 | 84 days supply | Qty: 36 | Fill #0

## 2019-06-08 MED FILL — LISINOPRIL 40 MG TABLET: 40 | 90 days supply | Qty: 90 | Fill #1

## 2019-06-23 ENCOUNTER — Telehealth: Payer: Self-pay | Admitting: Cardiology

## 2019-06-23 NOTE — Telephone Encounter (Signed)
Called patient, he states that he had this issue for the past year and last time he seen Dr.Christopher- she mentioned doing a monitor for him, but it has been over a year since last visit. Patient states he stated with his HR last night in the 130's for about an hour, and then it came back down into normal- he states that he has felt fine, no other symptoms- they just come about more frequently than before and was told to call back if issues.  Patient was offered an in office visit for next week- but he states he would do a virtual this week and at least speak with Dr.Chrisopher about the issues he is having.  Advised patient to continue to monitor the HR and BP (which he states has been normal recently), drink plenty of water, and when his HR becomes elevated to sit down and rest and take deep breaths. If any other major concerns to call us back-  Patient agreed for virtual on Friday, but advised patient I would route to MD for any other recommendations.

## 2019-06-23 NOTE — Telephone Encounter (Signed)
STAT if HR is under 50 or over 120 (normal HR is 60-100 beats per minute)  1) What is your heart rate? Last night it was between 132 to 140, today it is 82  2) Do you have a log of your heart rate readings (document readings)? It recorded on his fit-bit  3) Do you have any other symptoms? When its up, he gets short of breath and lightheaded

## 2019-06-26 ENCOUNTER — Telehealth (INDEPENDENT_AMBULATORY_CARE_PROVIDER_SITE_OTHER): Payer: No Typology Code available for payment source | Admitting: Cardiology

## 2019-06-26 ENCOUNTER — Other Ambulatory Visit: Payer: Self-pay | Admitting: Family Medicine

## 2019-06-26 ENCOUNTER — Telehealth: Payer: Self-pay | Admitting: *Deleted

## 2019-06-26 DIAGNOSIS — R002 Palpitations: Secondary | ICD-10-CM | POA: Diagnosis not present

## 2019-06-26 DIAGNOSIS — Z720 Tobacco use: Secondary | ICD-10-CM | POA: Diagnosis not present

## 2019-06-26 DIAGNOSIS — Z716 Tobacco abuse counseling: Secondary | ICD-10-CM

## 2019-06-26 MED FILL — CHLORTHALIDONE 25 MG TABS: 25 | 90 days supply | Qty: 90 | Fill #1

## 2019-06-26 MED FILL — BYSTOLIC 10 MG TABLET: 10 | 90 days supply | Qty: 90 | Fill #1

## 2019-06-26 NOTE — Patient Instructions (Signed)
Medication Instructions:  Your Physician recommend you continue on your current medication as directed.    *If you need a refill on your cardiac medications before your next appointment, please call your pharmacy*  Lab Work: None  Testing/Procedures: Our physician has recommended that you wear an [place] monitor. The Zio patch cardiac monitor continuously records heart rhythm data for up to 14 days, this is for patients being evaluated for multiple types heart rhythms. For the first 24 hours post application, please avoid getting the Zio monitor wet in the shower or by excessive sweating during exercise. After that, feel free to carry on with regular activities. Keep soaps and lotions away from the ZIO XT Patch.   Someone will contact you to have monitor mailed.      Follow-Up: At [facility], you and your health needs are our priority.  As part of our continuing mission to provide you with exceptional heart care, we have created designated Provider Care Teams.  These Care Teams include your primary Cardiologist (physician) and Advanced Practice Providers (APPs -  Physician Assistants and Nurse Practitioners) who all work together to provide you with the care you need, when you need it.  Your next appointment:   6 weeks  The format for your next appointment:   In Person  Provider:   Buford Dresser, MD

## 2019-06-26 NOTE — Progress Notes (Signed)
Virtual Visit via Telephone Note   This visit type was conducted due to national recommendations for restrictions regarding the COVID-19 Pandemic (e.g. social distancing) in an effort to limit this patient's exposure and mitigate transmission in our community.  Due to his co-morbid illnesses, this patient is at least at moderate risk for complications without adequate follow up.  This format is felt to be most appropriate for this patient at this time.  The patient did not have access to video technology/had technical difficulties with video requiring transitioning to audio format only (telephone).  All issues noted in this document were discussed and addressed.  No physical exam could be performed with this format.  Please refer to the patient's chart for his  consent to telehealth for [facility].   Date:  06/26/2019   ID:  Carlos Patterson, DOB Nov 16, 1970, MRN [id number]  Patient Location: Home Provider Location: Home  PCP:  Marin Olp, MD  Cardiologist:  Buford Dresser, MD  Electrophysiologist:  None   Evaluation Performed:  Follow-Up Visit  Chief Complaint:  Follow up, palpitations  History of Present Illness:    Carlos Patterson is a 48 y.o. male with a hx of hypertension, diabetes, tobacco use and hyperlipidemia  The patient does not have symptoms concerning for COVID-19 infection (fever, chills, cough, or new shortness of breath).   Today: Notes increasing frequency of palpitations. It had been once every 3-4 mos, then 1x/mos, now several times/week. Lasts a few minutes each time. Does get lightheaded for a bit but no syncope. Feels mildly short of breath with them, better if he takes a deep breath. No chest pain. No edema.  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 Has been working hard to drink water. Rarely thirsty, doesn't like to drink water.  Staying hydrated hasn't changed frequency of palpitations. Reviewed meds  with him. Has been trying to test what triggers palpations. Has checked BP at work, 130s/80s. Working on losing weight, trying to stay active.   Cutting back on tobacco, working to quit. Noticed an increased used with the pandemic. Now down to 3-5 cigarettes on work days, down to 7-8 on off days. Encouraged him to aim for complete cessation.  Denies chest pain, shortness of breath at rest or with normal exertion. No PND, orthopnea, LE edema or unexpected weight gain. No syncope.   Past Medical History:  Diagnosis Date  . Acne vulgaris   . Chicken pox   . Diabetes mellitus   . Hyperlipidemia   . Hypertension    Past Surgical History:  Procedure Laterality Date  . PILONIDAL CYST EXCISION     removal     Current Meds  Medication Sig  . amLODipine (NORVASC) 10 MG tablet TAKE 1 TABLET BY MOUTH ONCE DAILY  . aspirin EC 81 MG tablet Take 81 mg by mouth daily.  Marland Kitchen atorvastatin (LIPITOR) 20 MG tablet Take 1 tablet (20 mg total) by mouth 3 (three) times a week.  . chlorthalidone (HYGROTON) 25 MG tablet TAKE 1 TABLET BY MOUTH ONCE DAILY  . ciclopirox (LOPROX) 0.77 % cream Apply 1 application topically 2 (two) times daily.  . clindamycin (CLINDAGEL) 1 % gel Apply topically 2 (two) times daily. To boils/pimples in axilla and groin for up to 7 days  . ketoconazole (NIZORAL) 2 % cream Apply 1 application topically daily. For tinea versicolor  . lisinopril (ZESTRIL) 40 MG tablet TAKE 1 TABLET (40 MG TOTAL)  BY MOUTH DAILY.  . nebivolol (BYSTOLIC) 10 MG tablet Take 1 tablet (10 mg total) by mouth daily.  . sildenafil (REVATIO) 20 MG tablet TAKE 2 TO 5 TABLETS BY MOUTH AS NEEDED ONCE EVERY 48 HOURS FOR ERECTILE DYSFUNCTION  . spironolactone (ALDACTONE) 25 MG tablet TAKE 1/2 TO 1 TABLET BY MOUTH DAILY     Allergies:   Patient has no known allergies.   Social History   Tobacco Use  . Smoking status: Current Every Day Smoker    Packs/day: 0.50    Types: Cigarettes  . Smokeless tobacco: Never  Used  Substance Use Topics  . Alcohol use: Yes    Alcohol/week: 1.0 - 2.0 standard drinks    Types: 1 - 2 Glasses of wine per week  . Drug use: Yes    Types: Marijuana    Comment: sparing     Family Hx: The patient's family history includes Alcohol abuse in his maternal grandmother; Aneurysm in his mother; CVA in his maternal grandmother; Diabetes in his mother; Hyperlipidemia in his unknown relative; Hypertension in his maternal grandfather; Other in his father.  ROS:   Please see the history of present illness.    Constitutional: Negative for chills, fever, night sweats, unintentional weight loss  HENT: Negative for ear pain and hearing loss.   Eyes: Negative for loss of vision and eye pain.  Respiratory: Negative for cough, sputum, wheezing.   Cardiovascular: See HPI. Gastrointestinal: Negative for abdominal pain, melena, and hematochezia.  Genitourinary: Negative for dysuria and hematuria.  Musculoskeletal: Negative for falls and myalgias.  Skin: Negative for itching and rash.  Neurological: Negative for focal weakness, focal sensory changes and loss of consciousness.  Endo/Heme/Allergies: Does not bruise/bleed easily.  All other systems reviewed and are negative.   Prior CV studies:   The following studies were reviewed today: None  Labs/Other Tests and Data Reviewed:    EKG:  An ECG dated 03/21/2018 was personally reviewed today and demonstrated:  NSR, 1st degree AV block  Recent Labs: 03/03/2019: ALT 29; BUN 16; Creatinine, Ser 1.02; Hemoglobin 14.7; Platelets 296.0; Potassium 4.2; Sodium 137   Recent Lipid Panel Lab Results  Component Value Date/Time   CHOL 173 03/03/2019 02:36 PM   TRIG 276.0 (H) 03/03/2019 02:36 PM   HDL 46.40 03/03/2019 02:36 PM   CHOLHDL 4 03/03/2019 02:36 PM   LDLCALC 102 (H) 07/16/2008 09:23 AM   LDLDIRECT 102.0 03/03/2019 02:36 PM    Wt Readings from Last 3 Encounters:  03/03/19 272 lb 6.4 oz (123.6 kg)  07/10/18 283 lb (128.4 kg)   03/21/18 283 lb 12.8 oz (128.7 kg)     Objective:    Vital Signs:  There were no vitals taken for this visit.  Speaking comfortably on the phone, no audible wheezing In no acute distress Alert and oriented Normal affect Normal speech  ASSESSMENT & PLAN:    Palpitations: has been worsening over recent months -will pursue event monitor to investigate etiology -if monitor abnormal, would get echocardiogram -already on bystolic, may need to adjust beta blocker regimen depending on results of monitor.  Hypertension: resistant, since he was young. Now reports good control when he checks at work -continue amlodipine 10 mg, chlorthalidone 25 mg, lisinopril 40 mg, bystolic 10 mg, and spironolactone 25 mg.   Tobacco use, cessation counseling: The patient was counseled on tobacco cessation today for 3 minutes.  Counseling included reviewing the risks of smoking tobacco products, how it impacts the patient's current medical diagnoses and  different strategies for quitting.  Pharmacotherapy to aid in tobacco cessation was not prescribed today.  CV risk counseling and primary prevention: -recommend heart healthy/Mediterranean diet, with whole grains, fruits, vegetable, fish, lean meats, nuts, and olive oil. Limit salt. -recommend moderate walking, 3-5 times/week for 30-50 minutes each session. Aim for at least 150 minutes.week. Goal should be pace of 3 miles/hours, or walking 1.5 miles in 30 minutes -recommend avoidance of tobacco products. Avoid excess alcohol. -Additional risk factor control:  -Diabetes: On aspirin given elevated ASCVD risk and diabetes  -Lipids: had issues with simvastatin in the past. Agreed to increase his atorvastatin after recent labs from weekly to three times/week  -Blood pressure control: as above  -Weight: working on weight loss -ASCVD risk score: The 10-year ASCVD risk score Mikey Bussing DC Brooke Bonito., et al., 2013) is: 27%   Values used to calculate the score:     Age: 82 years      Sex: Male     Is Non-Hispanic African American: Yes     Diabetic: Yes     Tobacco smoker: Yes     Systolic Blood Pressure: [id number] mmHg     Is BP treated: Yes     HDL Cholesterol: 46.4 mg/dL     Total Cholesterol: 173 mg/dL   COVID-19 Education: The signs and symptoms of COVID-19 were discussed with the patient and how to seek care for testing (follow up with PCP or arrange E-visit).  The importance of social distancing was discussed today.  Time:   Today, I have spent 16 minutes with the patient with telehealth technology discussing the above problems.     Medication Adjustments/Labs and Tests Ordered: Current medicines are reviewed at length with the patient today.  Concerns regarding medicines are outlined above.   Tests Ordered: Orders Placed This Encounter  Procedures  . LONG TERM MONITOR (3-14 DAYS)    Medication Changes: No orders of the defined types were placed in this encounter.  Patient Instructions  Medication Instructions:  Your Physician recommend you continue on your current medication as directed.    *If you need a refill on your cardiac medications before your next appointment, please call your pharmacy*  Lab Work: None  Testing/Procedures: Our physician has recommended that you wear an [place] monitor. The Zio patch cardiac monitor continuously records heart rhythm data for up to 14 days, this is for patients being evaluated for multiple types heart rhythms. For the first 24 hours post application, please avoid getting the Zio monitor wet in the shower or by excessive sweating during exercise. After that, feel free to carry on with regular activities. Keep soaps and lotions away from the ZIO XT Patch.   Someone will contact you to have monitor mailed.      Follow-Up: At [facility], you and your health needs are our priority.  As part of our continuing mission to provide you with exceptional heart care, we have created designated Provider  Care Teams.  These Care Teams include your primary Cardiologist (physician) and Advanced Practice Providers (APPs -  Physician Assistants and Nurse Practitioners) who all work together to provide you with the care you need, when you need it.  Your next appointment:   6 weeks  The format for your next appointment:   In Person  Provider:   Buford Dresser, MD      Follow Up:  6 weeks  Signed, Buford Dresser, MD  06/26/2019 8:29 PM    [facility]

## 2019-06-26 NOTE — Telephone Encounter (Signed)
Left message regarding 6 week follow  Up appointment with Dr. Harrell Gave scheduled for Wednesday 08/12/19 at 9:00 am in office---requested return confirmation call from patient

## 2019-06-29 MED FILL — SPIRONOLACTONE 25 MG TABS: 25 | 30 days supply | Qty: 30 | Fill #0

## 2019-06-30 ENCOUNTER — Encounter: Payer: Self-pay | Admitting: Cardiology

## 2019-07-09 ENCOUNTER — Telehealth: Payer: Self-pay

## 2019-07-09 NOTE — Telephone Encounter (Signed)
LM with monitor instructions. 14 day ZIO ordered.

## 2019-07-10 MED FILL — SILDENAFIL CITRATE 20 MG TA: 20 | 20 days supply | Qty: 50 | Fill #1

## 2019-07-22 MED FILL — AMLODIPINE BESYLATE 10 MG T: 10 | 90 days supply | Qty: 90 | Fill #1

## 2019-07-23 ENCOUNTER — Ambulatory Visit (INDEPENDENT_AMBULATORY_CARE_PROVIDER_SITE_OTHER): Payer: No Typology Code available for payment source

## 2019-07-23 DIAGNOSIS — R002 Palpitations: Secondary | ICD-10-CM

## 2019-08-07 NOTE — Telephone Encounter (Signed)
Can we reschedule Carlos Patterson appt? I do not think we will have the monitor results back in time. Thank you!

## 2019-08-12 ENCOUNTER — Ambulatory Visit: Payer: No Typology Code available for payment source | Admitting: Cardiology

## 2019-08-26 ENCOUNTER — Encounter: Payer: Self-pay | Admitting: Cardiology

## 2019-08-26 ENCOUNTER — Telehealth: Payer: Self-pay | Admitting: Cardiology

## 2019-08-26 ENCOUNTER — Telehealth (INDEPENDENT_AMBULATORY_CARE_PROVIDER_SITE_OTHER): Payer: No Typology Code available for payment source | Admitting: Cardiology

## 2019-08-26 VITALS — BP 138/82 | HR 82 | Ht 79.0 in | Wt 280.0 lb

## 2019-08-26 DIAGNOSIS — Z712 Person consulting for explanation of examination or test findings: Secondary | ICD-10-CM

## 2019-08-26 DIAGNOSIS — Z720 Tobacco use: Secondary | ICD-10-CM

## 2019-08-26 DIAGNOSIS — Z7189 Other specified counseling: Secondary | ICD-10-CM

## 2019-08-26 DIAGNOSIS — I471 Supraventricular tachycardia: Secondary | ICD-10-CM

## 2019-08-26 DIAGNOSIS — Z716 Tobacco abuse counseling: Secondary | ICD-10-CM

## 2019-08-26 DIAGNOSIS — R002 Palpitations: Secondary | ICD-10-CM | POA: Diagnosis not present

## 2019-08-26 DIAGNOSIS — I10 Essential (primary) hypertension: Secondary | ICD-10-CM

## 2019-08-26 NOTE — Telephone Encounter (Signed)
     I left a message for pt to call back and make an appt  For an Echo and appt with Dr Harrell Gave in March.

## 2019-08-26 NOTE — Patient Instructions (Addendum)
Medication Instructions:  Your Physician recommend you continue on your current medication as directed.    *If you need a refill on your cardiac medications before your next appointment, please call your pharmacy*  Lab Work: Your physician recommends that you return for lab work (BMP, TSH, CBC)  If you have labs (blood work) drawn today and your tests are completely normal, you will receive your results only by: Marland Kitchen MyChart Message (if you have MyChart) OR . A paper copy in the mail If you have any lab test that is abnormal or we need to change your treatment, we will call you to review the results.  Testing/Procedures: Your physician has requested that you have an echocardiogram. Echocardiography is a painless test that uses sound waves to create images of your heart. It provides your doctor with information about the size and shape of your heart and how well your heart's chambers and valves are working. This procedure takes approximately one hour. There are no restrictions for this procedure. [address]   Follow-Up: At [facility], you and your health needs are our priority.  As part of our continuing mission to provide you with exceptional heart care, we have created designated Provider Care Teams.  These Care Teams include your primary Cardiologist (physician) and Advanced Practice Providers (APPs -  Physician Assistants and Nurse Practitioners) who all work together to provide you with the care you need, when you need it.  Your next appointment:   3 month(s)  The format for your next appointment:   In Person  Provider:   Buford Dresser, MD   Supraventricular Tachycardia, Adult Supraventricular tachycardia (SVT) is a type of abnormal heart rhythm. It causes the heart to beat very quickly and then return to a normal speed. A normal resting heart rate is 60-100 beats per minute. During an episode of SVT, your heart rate may be higher than 150 beats per  minute. Episodes of SVT can be frightening, but they are usually not dangerous. However, if episodes happen several times per day or last longer than a few seconds, they may lead to heart failure. What are the causes?  Usually, a normal heartbeat starts when an area called the sinoatrial node releases an electrical signal. In SVT, other areas of the heart send out electrical signals that interfere with the signal from the sinoatrial node. It is not known why some people get SVT and others do not. What increases the risk? You are more likely to develop this condition if you are:  62-55 years old.  A woman. The following factors may make you more likely to develop this condition:  Stress.  Tiredness.  Smoking.  Stimulant drugs, such as cocaine and methamphetamine.  Alcohol.  Caffeine.  Pregnancy.  Anxiety. What are the signs or symptoms? Symptoms of this condition include:  A pounding heart.  A feeling that the heart is skipping beats (palpitations).  Weakness.  Shortness of breath.  Tightness or pain in your chest.  Light-headedness.  Anxiety.  Dizziness.  Sweating.  Nausea.  Fainting.  Fatigue or tiredness. A mild episode may not cause symptoms. How is this diagnosed? This condition may be diagnosed based on:  Your symptoms.  A physical exam. ? If you have an episode of SVT during the exam, the health care provider may be able to diagnose SVT by listening to your heart and feeling your pulse.  Tests. These may include: ? An electrocardiogram (ECG). This test is done to  check for problems with electrical activity in the heart. ? A Holter monitor or event monitor test. This test involves wearing a portable device that monitors your heart rate over time. ? An echocardiogram. This test involves taking an image of your heart using sound waves. It is done to rule out other causes of a fast heart rate. ? Blood tests. How is this treated? This condition may  be treated with:  Vagal nerve stimulation. The treatment involves stimulating your vagus nerve, which slows down the heart. It is often the first and only treatment that is needed for this condition. Work with your health care provider to find which one works best for you. Ways to do this treatment include: ? Holding your breath and pushing, as though you are having a bowel movement. ? Massaging an area on one side of your neck, below your jaw. Do not try this yourself. Only a health care provider should do this. If done the wrong way, it can lead to a stroke. ? Bending forward with your head between your legs. ? Coughing while bending forward with your head between your legs. ? Closing your eyes and massaging your eyeballs. A health care provider should guide you through this method before you try it on your own.  Medicines that prevent attacks.  Medicine to stop an attack. The medicine is given through an IV at the hospital.  A small electric shock (cardioversion) that stops an attack. Before you get the shock, you will get medicine to make you fall asleep.  Radiofrequency ablation. In this procedure, a small, thin tube (catheter) is used to send radiofrequency energy to the area of tissue that is causing the rapid heartbeats. The energy kills the cells and helps your heart keep a normal rhythm. You may have this treatment if you have symptoms of SVT often. If you do not have symptoms, you may not need treatment. Follow these instructions at home: Stress  Avoid stressful situations when possible.  Find healthy ways of managing stress that work for you. Some healthy ways to manage stress include: ? Taking part in relaxing activities, such as yoga, meditation, or being out in nature. ? Listening to relaxing music. ? Practicing relaxation techniques, such as deep breathing. ? Leading a healthy lifestyle. This involves getting plenty of sleep, exercising, and eating a balanced  diet. ? Attending counseling or talk therapy with a mental health professional. Lifestyle   Try to get at least 7 hours of sleep each night.  Do not use any products that contain nicotine or tobacco, such as cigarettes, e-cigarettes, and chewing tobacco. If you need help quitting, ask your health care provider.  Be aware of how alcohol affects your condition. If alcohol: ? Triggers episodes of SVT, do not drink alcohol. ? Does not seem to trigger episodes, limit alcohol intake to no more than 1 drink a day for nonpregnant women and 2 drinks a day for men. Be aware of how much alcohol is in your drink. In the [place] one drink equals one 12 oz bottle of beer (355 mL), one 5 oz glass of wine (148 mL), or one 1 oz glass of hard liquor (44 mL).  Be aware of how caffeine affects your condition. If caffeine: ? Triggers episodes of SVT, do not eat, drink, or use anything with caffeine in it. ? Does not seem to trigger episodes, consume caffeine in moderation.  Do not use stimulant drugs. If you need help quitting, talk with your health  care provider. General instructions  Maintain a healthy weight.  Exercise regularly. Ask your health care provider to suggest some good activities for you. Aim for one or a combination of the following: ? 150 minutes per week of moderate exercise, such as walking or yoga. ? 75 minutes per week of vigorous exercise, such as running or swimming.  Perform vagus nerve stimulation as directed by your health care provider.  Take over-the-counter and prescription medicines only as told by your health care provider.  Keep all follow-up visits as told by your health care provider. This is important. Contact a health care provider if:  You have episodes of SVT more often than before.  Episodes of SVT last longer than before.  Vagus nerve stimulation is no longer helping.  You have new symptoms. Get help right away if:  You have chest pain.  Your symptoms get  worse.  You have trouble breathing.  You have an episode of SVT that lasts longer than 20 minutes.  You faint. These symptoms may represent a serious problem that is an emergency. Do not wait to see if the symptoms will go away. Get medical help right away. Call your local emergency services (911 in the [place] Do not drive yourself to the hospital. Summary  Supraventricular tachycardia (SVT) is a type of abnormal heart rhythm.  During an episode of SVT, your heart rate may be higher than 150 beats per minute.  Treatment depends on frequency of occurrence and symptoms experienced. This information is not intended to replace advice given to you by your health care provider. Make sure you discuss any questions you have with your health care provider. Document Released: 08/20/2005 Document Revised: 07/08/2018 Document Reviewed: 07/08/2018 Elsevier Patient Education  2020 [employer].

## 2019-08-26 NOTE — Progress Notes (Signed)
Virtual Visit via Telephone Note   This visit type was conducted due to national recommendations for restrictions regarding the COVID-19 Pandemic (e.g. social distancing) in an effort to limit this patient's exposure and mitigate transmission in our community.  Due to his co-morbid illnesses, this patient is at least at moderate risk for complications without adequate follow up.  This format is felt to be most appropriate for this patient at this time.  The patient did not have access to video technology/had technical difficulties with video requiring transitioning to audio format only (telephone).  All issues noted in this document were discussed and addressed.  No physical exam could be performed with this format.  Please refer to the patient's chart for his  consent to telehealth for [facility].   Date:  08/26/2019   ID:  Carlos Patterson, DOB [birth year], MRN [id number]  Patient Location: Home Provider Location: Home  PCP:  Marin Olp, MD  Cardiologist:  Buford Dresser, MD  Electrophysiologist:  None   Evaluation Performed:  Follow-Up Visit  Chief Complaint:  Discuss monitor results  History of Present Illness:    Carlos Patterson is a 48 y.o. male with hx of hypertension, diabetes, tobacco use and hyperlipidemia. Initial visit with me 06/26/19 for palpitations.  The patient does not have symptoms concerning for COVID-19 infection (fever, chills, cough, or new shortness of breath).   Today: We reviewed his monitor results together today, which showed a high burden of SVT. He noted a lot of palpitations the first week of the monitor, but since then has had fewer symptoms. We went through the monitor dates together, many were while he was working, but not all. They appeared to come in clusters on certain days, and then other days there was minimal burden. No clear aggravating/alleviating factors.  We reviewed what SVT is, management, etc. He is already on  bisoprolol (and was while wearing the monitor as well). My recommendation was to have him speak to EP to discuss SVT ablation. We discussed this in detail today, and he would be interested if he is felt to be a candidate. In anticipation of this, we will order echo and labs today.  Blood pressure also under much better control. He would like to scale back on meds in the future if able, but he is happy that it is at least well controlled at the moment.  He is still smoking, working on quitting. Encouraged him to keep trying to quit.  Denies chest pain, shortness of breath at rest or with normal exertion. No PND, orthopnea, LE edema or unexpected weight gain. No syncope.   Past Medical History:  Diagnosis Date  . Acne vulgaris   . Chicken pox   . Diabetes mellitus   . Hyperlipidemia   . Hypertension    Past Surgical History:  Procedure Laterality Date  . PILONIDAL CYST EXCISION     removal     Current Meds  Medication Sig  . amLODipine (NORVASC) 10 MG tablet TAKE 1 TABLET BY MOUTH ONCE DAILY  . aspirin EC 81 MG tablet Take 81 mg by mouth daily.  Marland Kitchen atorvastatin (LIPITOR) 20 MG tablet Take 1 tablet (20 mg total) by mouth 3 (three) times a week.  . chlorthalidone (HYGROTON) 25 MG tablet TAKE 1 TABLET BY MOUTH ONCE DAILY  . ciclopirox (LOPROX) 0.77 % cream Apply 1 application topically 2 (two) times daily.  . clindamycin (CLINDAGEL) 1 % gel Apply topically 2 (two) times daily. To boils/pimples  in axilla and groin for up to 7 days  . ketoconazole (NIZORAL) 2 % cream Apply 1 application topically daily. For tinea versicolor  . lisinopril (ZESTRIL) 40 MG tablet TAKE 1 TABLET (40 MG TOTAL) BY MOUTH DAILY.  . nebivolol (BYSTOLIC) 10 MG tablet Take 1 tablet (10 mg total) by mouth daily.  . sildenafil (REVATIO) 20 MG tablet TAKE 2 TO 5 TABLETS BY MOUTH AS NEEDED ONCE EVERY 48 HOURS FOR ERECTILE DYSFUNCTION  . spironolactone (ALDACTONE) 25 MG tablet TAKE 1/2 TO 1 TABLET BY MOUTH DAILY      Allergies:   Patient has no known allergies.   Social History   Tobacco Use  . Smoking status: Current Every Day Smoker    Packs/day: 0.50    Types: Cigarettes  . Smokeless tobacco: Never Used  Substance Use Topics  . Alcohol use: Yes    Alcohol/week: 1.0 - 2.0 standard drinks    Types: 1 - 2 Glasses of wine per week  . Drug use: Yes    Types: Marijuana    Comment: sparing     Family Hx: The patient's family history includes Alcohol abuse in his maternal grandmother; Aneurysm in his mother; CVA in his maternal grandmother; Diabetes in his mother; Hyperlipidemia in his unknown relative; Hypertension in his maternal grandfather; Other in his father.  ROS:   Please see the history of present illness.    Constitutional: Negative for chills, fever, night sweats, unintentional weight loss  HENT: Negative for ear pain and hearing loss.   Eyes: Negative for loss of vision and eye pain.  Respiratory: Negative for cough, sputum, wheezing.   Cardiovascular: See HPI. Gastrointestinal: Negative for abdominal pain, melena, and hematochezia.  Genitourinary: Negative for dysuria and hematuria.  Musculoskeletal: Negative for falls and myalgias.  Skin: Negative for itching and rash.  Neurological: Negative for focal weakness, focal sensory changes and loss of consciousness.  Endo/Heme/Allergies: Does not bruise/bleed easily.  All other systems reviewed and are negative.   Prior CV studies:   The following studies were reviewed today:  Monitor 07/23/19 14 days of data recorded on Zio monitor. Patient had a min HR of 51 bpm, max HR of 190 bpm, and avg HR of 79 bpm. Predominant underlying rhythm was Sinus Rhythm. No atrial fibrillation, high degree block, or pauses noted. There were 845 SVT episodes, ranging in rate from 105-193 bpm.  Longest duration was 57 seconds. There is one 4 beat event labeled as NSVT at a rate of 143 bpm, however rate related aberrancy not excluded.  Isolated  ventricular ectopy was rare (<1%), and isolated atrial ectopy was occasional (1%). There were 13 triggered events, many near SVT events. Most frequent SVT events occurred between 6pm-midnight, though sporadic events occurred at all times of the day.  Labs/Other Tests and Data Reviewed:    EKG:  An ECG dated 03/21/2018 was personally reviewed today and demonstrated:  SR 1st degree AV block  Recent Labs: 03/03/2019: ALT 29; BUN 16; Creatinine, Ser 1.02; Hemoglobin 14.7; Platelets 296.0; Potassium 4.2; Sodium 137   Recent Lipid Panel Lab Results  Component Value Date/Time   CHOL 173 03/03/2019 02:36 PM   TRIG 276.0 (H) 03/03/2019 02:36 PM   HDL 46.40 03/03/2019 02:36 PM   CHOLHDL 4 03/03/2019 02:36 PM   LDLCALC 102 (H) 07/16/2008 09:23 AM   LDLDIRECT 102.0 03/03/2019 02:36 PM    Wt Readings from Last 3 Encounters:  08/26/19 280 lb (127 kg)  03/03/19 272 lb 6.4 oz (123.6  kg)  07/10/18 283 lb (128.4 kg)     Objective:    Vital Signs:  BP 138/82   Pulse 82   Ht 6\' 7"  (2.007 m)   Wt 280 lb (127 kg)   BMI 31.54 kg/m    Speaking comfortably on the phone, no audible wheezing In no acute distress Alert and oriented Normal affect Normal speech  ASSESSMENT & PLAN:    Palpitations: recent worsening of symptoms. Monitor shows frequent SVT, discussed monitor test results together today -echo, cbc, bmet, tsh today -already on beta blocker, had SVT despite this -as it is increasing in frequency, high burden, and symptomatic despite medical therapy, will refer to EP for consideration of SVT ablation  Hypertension: improving control on current therapies -continue amlodipine 10 mg, chlorthalidone 25 mg, lisinopril 40 mg, bystolic 10 mg, and spironolactone 25 mg.   Tobacco use, cessation counseling: The patient was counseled on tobacco cessation today for 3 minutes.  Counseling included reviewing the risks of smoking tobacco products, how it impacts the patient's current medical  diagnoses and different strategies for quitting.  Pharmacotherapy to aid in tobacco cessation was not prescribed today.  COVID-19 Education: The signs and symptoms of COVID-19 were discussed with the patient and how to seek care for testing (follow up with PCP or arrange E-visit).  The importance of social distancing was discussed today.  Time:   Today, I have spent 18 minutes with the patient with telehealth technology discussing the above problems.    Medication Adjustments/Labs and Tests Ordered: Current medicines are reviewed at length with the patient today.  Concerns regarding medicines are outlined above.   Tests Ordered: Orders Placed This Encounter  Procedures  . Basic metabolic panel  . CBC  . TSH  . Ambulatory referral to Cardiac Electrophysiology  . ECHOCARDIOGRAM COMPLETE    Patient Instructions  Medication Instructions:  Your Physician recommend you continue on your current medication as directed.    *If you need a refill on your cardiac medications before your next appointment, please call your pharmacy*  Lab Work: Your physician recommends that you return for lab work (BMP, TSH, CBC)  If you have labs (blood work) drawn today and your tests are completely normal, you will receive your results only by: Marland Kitchen MyChart Message (if you have MyChart) OR . A paper copy in the mail If you have any lab test that is abnormal or we need to change your treatment, we will call you to review the results.  Testing/Procedures: Your physician has requested that you have an echocardiogram. Echocardiography is a painless test that uses sound waves to create images of your heart. It provides your doctor with information about the size and shape of your heart and how well your heart's chambers and valves are working. This procedure takes approximately one hour. There are no restrictions for this procedure. [place] [address]   Follow-Up: At [facility], you and your  health needs are our priority.  As part of our continuing mission to provide you with exceptional heart care, we have created designated Provider Care Teams.  These Care Teams include your primary Cardiologist (physician) and Advanced Practice Providers (APPs -  Physician Assistants and Nurse Practitioners) who all work together to provide you with the care you need, when you need it.  Your next appointment:   3 month(s)  The format for your next appointment:   In Person  Provider:   Buford Dresser, MD   Supraventricular Tachycardia,  Adult Supraventricular tachycardia (SVT) is a type of abnormal heart rhythm. It causes the heart to beat very quickly and then return to a normal speed. A normal resting heart rate is 60-100 beats per minute. During an episode of SVT, your heart rate may be higher than 150 beats per minute. Episodes of SVT can be frightening, but they are usually not dangerous. However, if episodes happen several times per day or last longer than a few seconds, they may lead to heart failure. What are the causes?  Usually, a normal heartbeat starts when an area called the sinoatrial node releases an electrical signal. In SVT, other areas of the heart send out electrical signals that interfere with the signal from the sinoatrial node. It is not known why some people get SVT and others do not. What increases the risk? You are more likely to develop this condition if you are:  62-24 years old.  A woman. The following factors may make you more likely to develop this condition:  Stress.  Tiredness.  Smoking.  Stimulant drugs, such as cocaine and methamphetamine.  Alcohol.  Caffeine.  Pregnancy.  Anxiety. What are the signs or symptoms? Symptoms of this condition include:  A pounding heart.  A feeling that the heart is skipping beats (palpitations).  Weakness.  Shortness of breath.  Tightness or pain in your  chest.  Light-headedness.  Anxiety.  Dizziness.  Sweating.  Nausea.  Fainting.  Fatigue or tiredness. A mild episode may not cause symptoms. How is this diagnosed? This condition may be diagnosed based on:  Your symptoms.  A physical exam. ? If you have an episode of SVT during the exam, the health care provider may be able to diagnose SVT by listening to your heart and feeling your pulse.  Tests. These may include: ? An electrocardiogram (ECG). This test is done to check for problems with electrical activity in the heart. ? A Holter monitor or event monitor test. This test involves wearing a portable device that monitors your heart rate over time. ? An echocardiogram. This test involves taking an image of your heart using sound waves. It is done to rule out other causes of a fast heart rate. ? Blood tests. How is this treated? This condition may be treated with:  Vagal nerve stimulation. The treatment involves stimulating your vagus nerve, which slows down the heart. It is often the first and only treatment that is needed for this condition. Work with your health care provider to find which one works best for you. Ways to do this treatment include: ? Holding your breath and pushing, as though you are having a bowel movement. ? Massaging an area on one side of your neck, below your jaw. Do not try this yourself. Only a health care provider should do this. If done the wrong way, it can lead to a stroke. ? Bending forward with your head between your legs. ? Coughing while bending forward with your head between your legs. ? Closing your eyes and massaging your eyeballs. A health care provider should guide you through this method before you try it on your own.  Medicines that prevent attacks.  Medicine to stop an attack. The medicine is given through an IV at the hospital.  A small electric shock (cardioversion) that stops an attack. Before you get the shock, you will get  medicine to make you fall asleep.  Radiofrequency ablation. In this procedure, a small, thin tube (catheter) is used to send radiofrequency energy  to the area of tissue that is causing the rapid heartbeats. The energy kills the cells and helps your heart keep a normal rhythm. You may have this treatment if you have symptoms of SVT often. If you do not have symptoms, you may not need treatment. Follow these instructions at home: Stress  Avoid stressful situations when possible.  Find healthy ways of managing stress that work for you. Some healthy ways to manage stress include: ? Taking part in relaxing activities, such as yoga, meditation, or being out in nature. ? Listening to relaxing music. ? Practicing relaxation techniques, such as deep breathing. ? Leading a healthy lifestyle. This involves getting plenty of sleep, exercising, and eating a balanced diet. ? Attending counseling or talk therapy with a mental health professional. Lifestyle   Try to get at least 7 hours of sleep each night.  Do not use any products that contain nicotine or tobacco, such as cigarettes, e-cigarettes, and chewing tobacco. If you need help quitting, ask your health care provider.  Be aware of how alcohol affects your condition. If alcohol: ? Triggers episodes of SVT, do not drink alcohol. ? Does not seem to trigger episodes, limit alcohol intake to no more than 1 drink a day for nonpregnant women and 2 drinks a day for men. Be aware of how much alcohol is in your drink. In the [place] one drink equals one 12 oz bottle of beer (355 mL), one 5 oz glass of wine (148 mL), or one 1 oz glass of hard liquor (44 mL).  Be aware of how caffeine affects your condition. If caffeine: ? Triggers episodes of SVT, do not eat, drink, or use anything with caffeine in it. ? Does not seem to trigger episodes, consume caffeine in moderation.  Do not use stimulant drugs. If you need help quitting, talk with your health care  provider. General instructions  Maintain a healthy weight.  Exercise regularly. Ask your health care provider to suggest some good activities for you. Aim for one or a combination of the following: ? 150 minutes per week of moderate exercise, such as walking or yoga. ? 75 minutes per week of vigorous exercise, such as running or swimming.  Perform vagus nerve stimulation as directed by your health care provider.  Take over-the-counter and prescription medicines only as told by your health care provider.  Keep all follow-up visits as told by your health care provider. This is important. Contact a health care provider if:  You have episodes of SVT more often than before.  Episodes of SVT last longer than before.  Vagus nerve stimulation is no longer helping.  You have new symptoms. Get help right away if:  You have chest pain.  Your symptoms get worse.  You have trouble breathing.  You have an episode of SVT that lasts longer than 20 minutes.  You faint. These symptoms may represent a serious problem that is an emergency. Do not wait to see if the symptoms will go away. Get medical help right away. Call your local emergency services (911 in the [place] Do not drive yourself to the hospital. Summary  Supraventricular tachycardia (SVT) is a type of abnormal heart rhythm.  During an episode of SVT, your heart rate may be higher than 150 beats per minute.  Treatment depends on frequency of occurrence and symptoms experienced. This information is not intended to replace advice given to you by your health care provider. Make sure you discuss any questions you have with your  health care provider. Document Released: 08/20/2005 Document Revised: 07/08/2018 Document Reviewed: 07/08/2018 Elsevier Patient Education  2020 [place], Buford Dresser, MD  08/26/2019 10:49 AM    [place]

## 2019-08-31 ENCOUNTER — Other Ambulatory Visit: Payer: Self-pay | Admitting: Family Medicine

## 2019-08-31 MED FILL — SPIRONOLACTONE 25 MG TABS: 25 | 30 days supply | Qty: 30 | Fill #1

## 2019-08-31 MED FILL — SILDENAFIL CITRATE 20 MG TA: 20 | 20 days supply | Qty: 50 | Fill #0

## 2019-08-31 MED FILL — ATORVASTATIN 20 MG TABLET: 20 | 84 days supply | Qty: 36 | Fill #1

## 2019-09-07 ENCOUNTER — Encounter: Payer: Self-pay | Admitting: Cardiology

## 2019-09-09 ENCOUNTER — Other Ambulatory Visit: Payer: Self-pay

## 2019-09-09 ENCOUNTER — Ambulatory Visit (HOSPITAL_COMMUNITY): Payer: No Typology Code available for payment source | Attending: Cardiovascular Disease

## 2019-09-09 DIAGNOSIS — I471 Supraventricular tachycardia: Secondary | ICD-10-CM

## 2019-09-24 ENCOUNTER — Other Ambulatory Visit: Payer: Self-pay | Admitting: Family Medicine

## 2019-09-24 MED FILL — LISINOPRIL 40 MG TABLET: 40 | 90 days supply | Qty: 90 | Fill #0

## 2019-10-19 ENCOUNTER — Other Ambulatory Visit: Payer: Self-pay | Admitting: Family Medicine

## 2019-10-20 MED FILL — BYSTOLIC 10 MG TABLET: 10 | 90 days supply | Qty: 90 | Fill #0

## 2019-10-20 MED FILL — CHLORTHALIDONE 25 MG TABS: 25 | 90 days supply | Qty: 90 | Fill #0

## 2019-11-10 ENCOUNTER — Encounter: Payer: Self-pay | Admitting: Family Medicine

## 2019-11-10 ENCOUNTER — Other Ambulatory Visit: Payer: Self-pay

## 2019-11-10 ENCOUNTER — Ambulatory Visit (INDEPENDENT_AMBULATORY_CARE_PROVIDER_SITE_OTHER): Payer: No Typology Code available for payment source | Admitting: Family Medicine

## 2019-11-10 VITALS — BP 142/92 | HR 80 | Temp 98.3°F | Ht 79.0 in | Wt 271.6 lb

## 2019-11-10 DIAGNOSIS — F17200 Nicotine dependence, unspecified, uncomplicated: Secondary | ICD-10-CM

## 2019-11-10 DIAGNOSIS — E1169 Type 2 diabetes mellitus with other specified complication: Secondary | ICD-10-CM

## 2019-11-10 DIAGNOSIS — E1159 Type 2 diabetes mellitus with other circulatory complications: Secondary | ICD-10-CM | POA: Diagnosis not present

## 2019-11-10 DIAGNOSIS — I10 Essential (primary) hypertension: Secondary | ICD-10-CM

## 2019-11-10 DIAGNOSIS — I152 Hypertension secondary to endocrine disorders: Secondary | ICD-10-CM

## 2019-11-10 DIAGNOSIS — E119 Type 2 diabetes mellitus without complications: Secondary | ICD-10-CM

## 2019-11-10 DIAGNOSIS — E785 Hyperlipidemia, unspecified: Secondary | ICD-10-CM

## 2019-11-10 LAB — CBC
HCT: 42 % (ref 39.0–52.0)
Hemoglobin: 14.2 g/dL (ref 13.0–17.0)
MCHC: 33.9 g/dL (ref 30.0–36.0)
MCV: 90.6 fl (ref 78.0–100.0)
Platelets: 281 10*3/uL (ref 150.0–400.0)
RBC: 4.64 Mil/uL (ref 4.22–5.81)
RDW: 13.4 % (ref 11.5–15.5)
WBC: 8.2 10*3/uL (ref 4.0–10.5)

## 2019-11-10 LAB — COMPREHENSIVE METABOLIC PANEL
ALT: 27 U/L (ref 0–53)
AST: 26 U/L (ref 0–37)
Albumin: 4.2 g/dL (ref 3.5–5.2)
Alkaline Phosphatase: 94 U/L (ref 39–117)
BUN: 15 mg/dL (ref 6–23)
CO2: 27 mEq/L (ref 19–32)
Calcium: 9.8 mg/dL (ref 8.4–10.5)
Chloride: 103 mEq/L (ref 96–112)
Creatinine, Ser: 1 mg/dL (ref 0.40–1.50)
GFR: 95.45 mL/min (ref >60.00)
Glucose, Bld: 119 mg/dL — ABNORMAL HIGH (ref 70–99)
Potassium: 4.3 mEq/L (ref 3.5–5.1)
Sodium: 135 mEq/L (ref 135–145)
Total Bilirubin: 0.5 mg/dL (ref 0.2–1.2)
Total Protein: 7 g/dL (ref 6.0–8.3)

## 2019-11-10 LAB — HEMOGLOBIN A1C: Hgb A1c MFr Bld: 6.4 % (ref 4.6–6.5)

## 2019-11-10 LAB — LDL CHOLESTEROL, DIRECT: Direct LDL: 70 mg/dL

## 2019-11-10 MED FILL — SPIRONOLACTONE 25 MG TABS: 25 | 30 days supply | Qty: 30 | Fill #2

## 2019-11-10 MED FILL — AMLODIPINE BESYLATE 10 MG T: 10 | 90 days supply | Qty: 90 | Fill #2

## 2019-11-10 NOTE — Patient Instructions (Addendum)
Health Maintenance Due  Topic Date Due  . COLONOSCOPY -has not scheduled yet. Please schedule after cardiology visit  03/14/2019  . HEMOGLOBIN A1C -today 09/02/2019  . OPHTHALMOLOGY EXAM -needs to schedule- have them sned [place] a copy pretty please 09/16/2019   Nutritionfacts.org has a lot of good information on plant based nutrition to help heal/treat different diseases including blood pressure, cholesterol, diabetes  Please quit smoking to help with blood pressure  Also get back on regular exercise  Blood pressure hair high today and hoping above changes help  Schedule physical June 30th or later. If blood pressure not trending down at home within a month or two of changes please let us know.

## 2019-11-10 NOTE — Progress Notes (Signed)
Phone [phone] In person visit   Subjective:   Carlos Patterson is a 49 y.o. year old very pleasant male patient who presents for/with See problem oriented charting Chief Complaint  Patient presents with  . Follow-up  . Diabetes  . Hypertension    This visit occurred during the SARS-CoV-2 public health emergency.  Safety protocols were in place, including screening questions prior to the visit, additional usage of staff PPE, and extensive cleaning of exam room while observing appropriate contact time as indicated for disinfecting solutions.   Past Medical History-  Patient Active Problem List   Diagnosis Date Noted  . Smoker 10/02/2016    Priority: High  . Diabetes mellitus type II, controlled ([place]) 05/14/2008    Priority: High  . Resistant hypertension 04/29/2007    Priority: High  . Hypertension associated with diabetes ([place]) 03/03/2019    Priority: Medium  . Palpitations 01/24/2018    Priority: Medium  . Hyperlipidemia associated with type 2 diabetes mellitus ([place]) 06/17/2007    Priority: Medium  . Erectile dysfunction 07/10/2018    Priority: Low  . Hidradenitis suppurativa 02/21/2018    Priority: Low  . Tinea versicolor 03/20/2017    Priority: Low  . Sebaceous cyst 11/28/2007    Priority: Low    Medications- reviewed and updated Current Outpatient Medications  Medication Sig Dispense Refill  . amLODipine (NORVASC) 10 MG tablet TAKE 1 TABLET BY MOUTH ONCE DAILY 90 tablet 3  . aspirin EC 81 MG tablet Take 81 mg by mouth daily.    Marland Kitchen atorvastatin (LIPITOR) 20 MG tablet Take 1 tablet (20 mg total) by mouth 3 (three) times a week. 40 tablet 3  . BYSTOLIC 10 MG tablet TAKE 1 TABLET BY MOUTH DAILY. 90 tablet 1  . chlorthalidone (HYGROTON) 25 MG tablet TAKE 1 TABLET BY MOUTH ONCE DAILY 90 tablet 1  . ciclopirox (LOPROX) 0.77 % cream Apply 1 application topically 2 (two) times daily. 90 g 1  . clindamycin (CLINDAGEL) 1 % gel Apply topically 2 (two) times daily. To  boils/pimples in axilla and groin for up to 7 days 60 g 2  . ketoconazole (NIZORAL) 2 % cream Apply 1 application topically daily. For tinea versicolor 60 g 1  . lisinopril (ZESTRIL) 40 MG tablet TAKE 1 TABLET (40 MG TOTAL) BY MOUTH DAILY. 90 tablet 0  . sildenafil (REVATIO) 20 MG tablet TAKE 2 TO 5 TABLETS BY MOUTH AS NEEDED ONCE EVERY 48 HOURS FOR ERECTILE DYSFUNCTION 50 tablet 1  . spironolactone (ALDACTONE) 25 MG tablet TAKE 1/2 TO 1 TABLET BY MOUTH DAILY 30 tablet 5   No current facility-administered medications for this visit.     Objective:  BP (!) 142/92   Pulse 80   Temp 98.3 F (36.8 C)   Ht 6\' 7"  (2.007 m)   Wt 271 lb 9.6 oz (123.2 kg)   SpO2 98%   BMI 30.60 kg/m  Gen: NAD, resting comfortably CV: RRR no murmurs rubs or gallops Lungs: CTAB no crackles, wheeze, rhonchi Ext: no edema Skin: warm, dry    Assessment and Plan   # smoker- up to at least 3/4 PPD off days and 1/3 PPD other days. Strongly encouraged quitting smoking- one of the best things he can do for his health.   #Social update- moved into new home after over 10 years- some stress with not being as comfy in the new space  # HM - Plans to schedule colonoscopy after cardiology follow up for palpiations if  they think its appropriate. Had consideration of SVT ablation but palpitations have been much better lately.   # Diabetes S: diet controlled CBGs- does not check sugar Exercise and diet- not exercising recently. More comfort foods. Weight stable from last visit.  Lab Results  Component Value Date   HGBA1C 6.2 03/03/2019   HGBA1C 6.4 07/10/2018   HGBA1C 6.5 (A) 01/24/2018   A/P: hopefully controlled- update a1c and he knows to get back on his healthy eating and regular exercise.   #hypertension S: compliant with amlodipine 10mg , bystolic 10mg , hygroton 25mg  and lisinorpil 40mg . BP Readings from Last 3 Encounters:  11/10/19 (!) 142/92  08/26/19 138/82  03/03/19 132/82  A/P: Poor control  today-compliant with medications but has not been exercising or eating as well as previous- We discussed focusing on more plant-based nutrition-he is going to investigate this further and consider-he has been doing more meat and vegetables and low-carb-I think I have your vegetable/plant-based push would help on multiple fronts as long as remains low in processed foods  #hyperlipidemia S: compliant with Lipitor 20mg  3x a week Lab Results  Component Value Date   CHOL 173 03/03/2019   HDL 46.40 03/03/2019   LDLCALC 102 (H) 07/16/2008   LDLDIRECT 102.0 03/03/2019   TRIG 276.0 (H) 03/03/2019   CHOLHDL 4 03/03/2019   A/P: mild poor control- instead of increasing meds- we decided to focus on healthy eating/regular exercise and recheck at physical.    Recommended follow up: Physical June 30 or later Future Appointments  Date Time Provider [place]  12/02/2019  8:00 AM Buford Dresser, MD [facility]    Lab/Order associations:   ICD-10-CM   1. Controlled type 2 diabetes mellitus without complication, without long-term current use of insulin (HCC)  E11.9 Direct LDL    Hemoglobin A1c    Comprehensive metabolic panel    CBC  2. Hypertension associated with diabetes ([place]) Chronic E11.59    I10   3. Hyperlipidemia associated with type 2 diabetes mellitus ([place])  E11.69    E78.5   4. Smoker  F17.200    Return precautions advised.  Garret Reddish, MD

## 2019-11-26 MED FILL — SILDENAFIL CITRATE 20 MG TA: 20 | 20 days supply | Qty: 50 | Fill #1

## 2019-12-02 ENCOUNTER — Telehealth (INDEPENDENT_AMBULATORY_CARE_PROVIDER_SITE_OTHER): Payer: No Typology Code available for payment source | Admitting: Cardiology

## 2019-12-02 VITALS — BP 140/87 | HR 72 | Ht 79.0 in | Wt 272.0 lb

## 2019-12-02 DIAGNOSIS — Z720 Tobacco use: Secondary | ICD-10-CM

## 2019-12-02 DIAGNOSIS — I10 Essential (primary) hypertension: Secondary | ICD-10-CM | POA: Diagnosis not present

## 2019-12-02 DIAGNOSIS — Z01812 Encounter for preprocedural laboratory examination: Secondary | ICD-10-CM

## 2019-12-02 DIAGNOSIS — R002 Palpitations: Secondary | ICD-10-CM

## 2019-12-02 DIAGNOSIS — I77810 Thoracic aortic ectasia: Secondary | ICD-10-CM | POA: Diagnosis not present

## 2019-12-02 DIAGNOSIS — I471 Supraventricular tachycardia: Secondary | ICD-10-CM | POA: Diagnosis not present

## 2019-12-02 DIAGNOSIS — I712 Thoracic aortic aneurysm, without rupture, unspecified: Secondary | ICD-10-CM

## 2019-12-02 DIAGNOSIS — Z7982 Long term (current) use of aspirin: Secondary | ICD-10-CM

## 2019-12-02 DIAGNOSIS — Z716 Tobacco abuse counseling: Secondary | ICD-10-CM | POA: Diagnosis not present

## 2019-12-02 NOTE — Patient Instructions (Signed)
Medication Instructions:  Your Physician recommend you continue on your current medication as directed.    *If you need a refill on your cardiac medications before your next appointment, please call your pharmacy*   Lab Work: Your physician recommends that you return for lab work 1 week prior to testing (BMP).  If you have labs (blood work) drawn today and your tests are completely normal, you will receive your results only by: Marland Kitchen MyChart Message (if you have MyChart) OR . A paper copy in the mail If you have any lab test that is abnormal or we need to change your treatment, we will call you to review the results.   Testing/Procedures: Chest CT Angiography (CTA), is a special type of CT scan that uses a computer to produce multi-dimensional views of major blood vessels throughout the body. In CT angiography, a contrast material is injected through an IV to help visualize the blood vessels [facility]   Follow-Up: At [facility], you and your health needs are our priority.  As part of our continuing mission to provide you with exceptional heart care, we have created designated Provider Care Teams.  These Care Teams include your primary Cardiologist (physician) and Advanced Practice Providers (APPs -  Physician Assistants and Nurse Practitioners) who all work together to provide you with the care you need, when you need it.  We recommend signing up for the patient portal called "MyChart".  Sign up information is provided on this After Visit Summary.  MyChart is used to connect with patients for Virtual Visits (Telemedicine).  Patients are able to view lab/test results, encounter notes, upcoming appointments, etc.  Non-urgent messages can be sent to your provider as well.   To learn more about what you can do with MyChart, go to [email or web address].    Your next appointment:   1 year(s)  The format for your next appointment:   In Person  Provider:   Buford Dresser,  MD

## 2019-12-02 NOTE — Progress Notes (Signed)
Virtual Visit via Telephone Note   This visit type was conducted due to national recommendations for restrictions regarding the COVID-19 Pandemic (e.g. social distancing) in an effort to limit this patient's exposure and mitigate transmission in our community.  Due to his co-morbid illnesses, this patient is at least at moderate risk for complications without adequate follow up.  This format is felt to be most appropriate for this patient at this time.  The patient did not have access to video technology/had technical difficulties with video requiring transitioning to audio format only (telephone).  All issues noted in this document were discussed and addressed.  No physical exam could be performed with this format.  Please refer to the patient's chart for his  consent to telehealth for [facility].   Date:  12/02/2019   ID:  Carlos Patterson, DOB Sep 05, 1970, MRN [id number]  Patient Location: Home Provider Location: Home  PCP:  Marin Olp, MD  Cardiologist:  Buford Dresser, MD  Electrophysiologist:  None   Evaluation Performed:  Follow-Up Visit  Chief Complaint:  Follow up  History of Present Illness:    Carlos Patterson is a 49 y.o. male with hx of hypertension, diabetes, tobacco use and hyperlipidemia. Initial visit with me 06/26/19 for palpitations.  The patient does not have symptoms concerning for COVID-19 infection (fever, chills, cough, or new shortness of breath).   Today: Has not had any palpitations since last visit. No clear changes that seem to link to reduced symptoms. He can now schedule his colonoscopy, which he had on hold.  BP up a bit a few weeks ago, but he just moved into a new place, attributed to stress.  Working on quitting smoking, congratulated and encouraged him on this.  Discussed echo results. Discussed plan to follow up with CT in about 6 mos.  Denies chest pain, shortness of breath at rest or with normal exertion. No PND, orthopnea,  LE edema or unexpected weight gain. No syncope or palpitations.   Past Medical History:  Diagnosis Date  . Acne vulgaris   . Chicken pox   . Diabetes mellitus   . Hyperlipidemia   . Hypertension    Past Surgical History:  Procedure Laterality Date  . PILONIDAL CYST EXCISION     removal     Current Meds  Medication Sig  . amLODipine (NORVASC) 10 MG tablet TAKE 1 TABLET BY MOUTH ONCE DAILY  . aspirin EC 81 MG tablet Take 81 mg by mouth daily.  Marland Kitchen atorvastatin (LIPITOR) 20 MG tablet Take 1 tablet (20 mg total) by mouth 3 (three) times a week.  Marland Kitchen BYSTOLIC 10 MG tablet TAKE 1 TABLET BY MOUTH DAILY.  . chlorthalidone (HYGROTON) 25 MG tablet TAKE 1 TABLET BY MOUTH ONCE DAILY  . ciclopirox (LOPROX) 0.77 % cream Apply topically 2 (two) times daily as needed.  . clindamycin (CLINDAGEL) 1 % gel Apply topically 2 (two) times daily. To boils/pimples in axilla and groin for up to 7 days  . ketoconazole (NIZORAL) 2 % cream Apply 1 application topically daily. For tinea versicolor  . lisinopril (ZESTRIL) 40 MG tablet TAKE 1 TABLET (40 MG TOTAL) BY MOUTH DAILY.  . sildenafil (REVATIO) 20 MG tablet TAKE 2 TO 5 TABLETS BY MOUTH AS NEEDED ONCE EVERY 48 HOURS FOR ERECTILE DYSFUNCTION  . spironolactone (ALDACTONE) 25 MG tablet TAKE 1/2 TO 1 TABLET BY MOUTH DAILY     Allergies:   Patient has no known allergies.   Social History  Tobacco Use  . Smoking status: Current Every Day Smoker    Packs/day: 0.50    Types: Cigarettes  . Smokeless tobacco: Never Used  Substance Use Topics  . Alcohol use: Yes    Alcohol/week: 1.0 - 2.0 standard drinks    Types: 1 - 2 Glasses of wine per week  . Drug use: Yes    Types: Marijuana    Comment: sparing     Family Hx: The patient's family history includes Alcohol abuse in his maternal grandmother; Aneurysm in his mother; CVA in his maternal grandmother; Diabetes in his mother; Hyperlipidemia in his unknown relative; Hypertension in his maternal grandfather;  Other in his father.  ROS:   Please see the history of present illness.     All other systems reviewed and are negative.   Prior CV studies:   The following studies were reviewed today: Echo 09/09/19 1. Left ventricular ejection fraction, by visual estimation, is 60 to  65%. The left ventricle has normal function. There is mildly increased  left ventricular hypertrophy.  2. The left ventricle has no regional wall motion abnormalities.  3. Global right ventricle has normal systolic function.The right  ventricular size is normal. No increase in right ventricular wall  thickness.  4. Left atrial size was normal.  5. Right atrial size was normal.  6. Presence of pericardial fat pad.  7. Trivial pericardial effusion is present.  8. The mitral valve is grossly normal. Trivial mitral valve  regurgitation.  9. The tricuspid valve is grossly normal.  10. The aortic valve is tricuspid. Aortic valve regurgitation is not  visualized. No evidence of aortic valve sclerosis or stenosis.  11. The pulmonic valve was grossly normal. Pulmonic valve regurgitation is  not visualized.  12. Aortic dilatation noted.  13. There is mild dilatation of the aortic root measuring 41 mm.  14. TR signal is inadequate for assessing pulmonary artery systolic  pressure.  15. The inferior vena cava is normal in size with greater than 50%  respiratory variability, suggesting right atrial pressure of 3 mmHg.  16. No prior Echocardiogram.   Monitor 07/23/19 14 days of data recorded on Zio monitor. Patient had a min HR of 51 bpm, max HR of 190 bpm, and avg HR of 79 bpm. Predominant underlying rhythm was Sinus Rhythm. No atrial fibrillation, high degree block, or pauses noted. There were 845 SVT episodes, ranging in rate from 105-193 bpm.  Longest duration was 57 seconds. There is one 4 beat event labeled as NSVT at a rate of 143 bpm, however rate related aberrancy not excluded.  Isolated ventricular ectopy was  rare (<1%), and isolated atrial ectopy was occasional (1%). There were 13 triggered events, many near SVT events. Most frequent SVT events occurred between 6pm-midnight, though sporadic events occurred at all times of the day.  Labs/Other Tests and Data Reviewed:    EKG:  An ECG dated 03/21/2018 was personally reviewed today and demonstrated:  SR 1st degree AV block  Recent Labs: 11/10/2019: ALT 27; BUN 15; Creatinine, Ser 1.00; Hemoglobin 14.2; Platelets 281.0; Potassium 4.3; Sodium 135   Recent Lipid Panel Lab Results  Component Value Date/Time   CHOL 173 03/03/2019 02:36 PM   TRIG 276.0 (H) 03/03/2019 02:36 PM   HDL 46.40 03/03/2019 02:36 PM   CHOLHDL 4 03/03/2019 02:36 PM   LDLCALC 102 (H) 07/16/2008 09:23 AM   LDLDIRECT 70.0 11/10/2019 12:09 PM    Wt Readings from Last 3 Encounters:  12/02/19 272 lb (123.4  kg)  11/10/19 271 lb 9.6 oz (123.2 kg)  08/26/19 280 lb (127 kg)     Objective:    Vital Signs:  BP 140/87   Pulse 72   Ht 6\' 7"  (2.007 m)   Wt 272 lb (123.4 kg)   BMI 30.64 kg/m    Speaking comfortably on the phone, no audible wheezing In no acute distress Alert and oriented Normal affect Normal speech  ASSESSMENT & PLAN:    Palpitations: -Monitor shows frequent SVT, but his symptoms have improved -echo, cbc, bmet, tsh within clear triggers -already on beta blocker, had SVT despite this -referred to EP, but as symptoms improved, he declined to schedule with them  Ascending aortic dilation:  -seen on echo, 41 mm -will schedule CTA for 6 mos, will need BMET prior -on aspirin 81 mg and atorvastatin 20 mg daily  Hypertension: slightly increased with stress of recent move, but had been well controlled -continue amlodipine 10 mg, chlorthalidone 25 mg, lisinopril 40 mg, bystolic 10 mg, and spironolactone 25 mg.   Tobacco use, cessation counseling: The patient was counseled on tobacco cessation today for 3 minutes.  Counseling included reviewing the risks of  smoking tobacco products, how it impacts the patient's current medical diagnoses and different strategies for quitting.  Pharmacotherapy to aid in tobacco cessation was not prescribed today.  COVID-19 Education: The signs and symptoms of COVID-19 were discussed with the patient and how to seek care for testing (follow up with PCP or arrange E-visit).  The importance of social distancing was discussed today.  Time:   Today, I have spent 7 minutes with the patient with telehealth technology discussing the above problems, in addition to the time spent in tobacco counseling.   Medication Adjustments/Labs and Tests Ordered: Current medicines are reviewed at length with the patient today.  Concerns regarding medicines are outlined above.   Tests Ordered: Orders Placed This Encounter  Procedures  . CT ANGIO CHEST AORTA W/CM & OR WO/CM  . Basic metabolic panel    Patient Instructions  Medication Instructions:  Your Physician recommend you continue on your current medication as directed.    *If you need a refill on your cardiac medications before your next appointment, please call your pharmacy*   Lab Work: Your physician recommends that you return for lab work 1 week prior to testing (BMP).  If you have labs (blood work) drawn today and your tests are completely normal, you will receive your results only by: Marland Kitchen MyChart Message (if you have MyChart) OR . A paper copy in the mail If you have any lab test that is abnormal or we need to change your treatment, we will call you to review the results.   Testing/Procedures: Chest CT Angiography (CTA), is a special type of CT scan that uses a computer to produce multi-dimensional views of major blood vessels throughout the body. In CT angiography, a contrast material is injected through an IV to help visualize the blood vessels [facility]   Follow-Up: At [facility], you and your health needs are our priority.  As part of our  continuing mission to provide you with exceptional heart care, we have created designated Provider Care Teams.  These Care Teams include your primary Cardiologist (physician) and Advanced Practice Providers (APPs -  Physician Assistants and Nurse Practitioners) who all work together to provide you with the care you need, when you need it.  We recommend signing up for the patient portal called "MyChart".  Sign  up information is provided on this After Visit Summary.  MyChart is used to connect with patients for Virtual Visits (Telemedicine).  Patients are able to view lab/test results, encounter notes, upcoming appointments, etc.  Non-urgent messages can be sent to your provider as well.   To learn more about what you can do with MyChart, go to [email or web address].    Your next appointment:   1 year(s)  The format for your next appointment:   In Person  Provider:   Buford Dresser, MD       Signed, Buford Dresser, MD  12/02/2019  [place]

## 2019-12-27 MED FILL — ATORVASTATIN 20 MG TABLET: 20 | 84 days supply | Qty: 36 | Fill #2

## 2020-01-12 ENCOUNTER — Encounter: Payer: Self-pay | Admitting: Cardiology

## 2020-01-12 DIAGNOSIS — I712 Thoracic aortic aneurysm, without rupture, unspecified: Secondary | ICD-10-CM | POA: Insufficient documentation

## 2020-01-12 DIAGNOSIS — I471 Supraventricular tachycardia, unspecified: Secondary | ICD-10-CM | POA: Insufficient documentation

## 2020-01-15 ENCOUNTER — Other Ambulatory Visit: Payer: Self-pay | Admitting: Family Medicine

## 2020-01-26 MED FILL — SPIRONOLACTONE 25 MG TABS: 25 | 30 days supply | Qty: 30 | Fill #3

## 2020-01-30 ENCOUNTER — Other Ambulatory Visit: Payer: Self-pay | Admitting: Family Medicine

## 2020-01-30 MED FILL — SILDENAFIL CITRATE 20 MG TA: 20 | 20 days supply | Qty: 50 | Fill #0

## 2020-02-05 MED FILL — CHLORTHALIDONE 25 MG TABS: 25 | 90 days supply | Qty: 90 | Fill #1

## 2020-02-05 MED FILL — BYSTOLIC 10 MG TABLET: 10 | 90 days supply | Qty: 90 | Fill #1

## 2020-03-11 MED FILL — AMLODIPINE BESYLATE 10 MG T: 10 | 90 days supply | Qty: 90 | Fill #3

## 2020-04-08 MED FILL — SPIRONOLACTONE 25 MG TABS: 25 | 30 days supply | Qty: 30 | Fill #4

## 2020-04-08 MED FILL — ATORVASTATIN 20 MG TABLET: 20 | 84 days supply | Qty: 36 | Fill #0

## 2020-05-06 ENCOUNTER — Other Ambulatory Visit: Payer: Self-pay | Admitting: Family Medicine

## 2020-05-10 MED FILL — LISINOPRIL 40 MG TABS: 40 | 90 days supply | Qty: 90 | Fill #0

## 2020-05-16 MED FILL — SILDENAFIL CITRATE 20 MG TA: 20 | 20 days supply | Qty: 50 | Fill #1

## 2020-05-23 ENCOUNTER — Telehealth: Payer: Self-pay | Admitting: Family Medicine

## 2020-05-23 ENCOUNTER — Telehealth: Payer: Self-pay | Admitting: Cardiology

## 2020-05-23 NOTE — Telephone Encounter (Signed)
Patient states he would like referral to get a routine colonoscopy. Please advise

## 2020-05-23 NOTE — Telephone Encounter (Signed)
Left message for patient to call and discuss scheduling CTA chest/aorta ordered by Dr. Harrell Gave

## 2020-05-24 ENCOUNTER — Other Ambulatory Visit: Payer: Self-pay

## 2020-05-24 ENCOUNTER — Other Ambulatory Visit: Payer: Self-pay | Admitting: Family Medicine

## 2020-05-24 DIAGNOSIS — Z1211 Encounter for screening for malignant neoplasm of colon: Secondary | ICD-10-CM

## 2020-05-24 MED FILL — BYSTOLIC 10 MG TABLET: 10 | 90 days supply | Qty: 90 | Fill #0

## 2020-05-24 MED FILL — CHLORTHALIDONE 25 MG TABS: 25 | 90 days supply | Qty: 90 | Fill #0

## 2020-05-24 NOTE — Telephone Encounter (Signed)
Referral has been placed. 

## 2020-05-25 ENCOUNTER — Telehealth: Payer: Self-pay

## 2020-05-25 DIAGNOSIS — Z01812 Encounter for preprocedural laboratory examination: Secondary | ICD-10-CM

## 2020-05-25 MED FILL — SILDENAFIL CITRATE 20 MG TA: 20 | 20 days supply | Qty: 50 | Fill #1

## 2020-05-25 NOTE — Telephone Encounter (Signed)
Called pt to inform he needs a BMP prior to CTA chest aorta. Left message to call back.   Order placed

## 2020-05-26 NOTE — Telephone Encounter (Signed)
Pt notified to come into office a couple days prior to CTA. Pt verbalized understanding.

## 2020-05-26 NOTE — Telephone Encounter (Signed)
Attempted to contact pt x 2. Left message to call back 

## 2020-06-07 LAB — BASIC METABOLIC PANEL
BUN/Creatinine Ratio: 12 (ref 9–20)
BUN: 11 mg/dL (ref 6–24)
CO2: 22 mmol/L (ref 20–29)
Calcium: 9.6 mg/dL (ref 8.7–10.2)
Chloride: 101 mmol/L (ref 96–106)
Creatinine, Ser: 0.93 mg/dL (ref 0.76–1.27)
GFR calc Af Amer: 109 mL/min/{1.73_m2} (ref >59)
GFR calc non Af Amer: 95 mL/min/{1.73_m2} (ref >59)
Glucose: 121 mg/dL — ABNORMAL HIGH (ref 65–99)
Potassium: 4 mmol/L (ref 3.5–5.2)
Sodium: 135 mmol/L (ref 134–144)

## 2020-06-08 ENCOUNTER — Ambulatory Visit (HOSPITAL_COMMUNITY)
Admission: RE | Admit: 2020-06-08 | Discharge: 2020-06-08 | Disposition: A | Discharge status: Home / Self Care | Payer: No Typology Code available for payment source | Source: Ambulatory Visit | Attending: Cardiology | Admitting: Cardiology

## 2020-06-08 ENCOUNTER — Other Ambulatory Visit: Payer: Self-pay

## 2020-06-08 DIAGNOSIS — I712 Thoracic aortic aneurysm, without rupture, unspecified: Secondary | ICD-10-CM

## 2020-06-08 MED ORDER — IOHEXOL 350 MG/ML SOLN
100.0000 mL | Freq: Once | INTRAVENOUS | Status: AC | PRN
  Administered 2020-06-08: 100 mL via INTRAVENOUS

## 2020-06-23 ENCOUNTER — Other Ambulatory Visit: Payer: Self-pay | Admitting: Family Medicine

## 2020-06-23 MED FILL — SPIRONOLACTONE 25 MG TABS: 25 | 30 days supply | Qty: 30 | Fill #5

## 2020-06-23 MED FILL — AMLODIPINE BESYLATE 10 MG T: 10 | 90 days supply | Qty: 90 | Fill #0

## 2020-07-22 ENCOUNTER — Other Ambulatory Visit: Payer: Self-pay | Admitting: Family Medicine

## 2020-07-22 MED FILL — ATORVASTATIN CALCIUM 20 MG: 20 | 84 days supply | Qty: 36 | Fill #0

## 2020-08-05 MED FILL — ATORVASTATIN CALCIUM 20 MG: 20 | 84 days supply | Qty: 36 | Fill #0

## 2020-08-08 ENCOUNTER — Other Ambulatory Visit: Payer: Self-pay | Admitting: Family Medicine

## 2020-08-08 MED FILL — SILDENAFIL CITRATE 20 MG TA: 20 | 20 days supply | Qty: 50 | Fill #0

## 2020-08-12 ENCOUNTER — Other Ambulatory Visit: Payer: No Typology Code available for payment source

## 2020-08-12 ENCOUNTER — Ambulatory Visit: Payer: No Typology Code available for payment source | Attending: Family

## 2020-08-12 DIAGNOSIS — Z23 Encounter for immunization: Secondary | ICD-10-CM

## 2020-08-30 ENCOUNTER — Other Ambulatory Visit: Payer: Self-pay | Admitting: Family Medicine

## 2020-08-30 MED FILL — LISINOPRIL 40 MG TABS: 40 | 90 days supply | Qty: 90 | Fill #0

## 2020-09-12 ENCOUNTER — Other Ambulatory Visit: Payer: Self-pay | Admitting: Family Medicine

## 2020-09-12 MED FILL — SPIRONOLACTONE 25 MG TABS: 25 | 30 days supply | Qty: 30 | Fill #0

## 2020-09-12 MED FILL — CHLORTHALIDONE 25 MG TABS: 25 | 90 days supply | Qty: 90 | Fill #1

## 2020-09-12 MED FILL — NEBIVOLOL HCL TAB 10 MG: 10 | 90 days supply | Qty: 90 | Fill #1

## 2020-09-23 ENCOUNTER — Encounter: Payer: No Typology Code available for payment source | Admitting: Family Medicine

## 2020-09-27 ENCOUNTER — Other Ambulatory Visit: Payer: Self-pay

## 2020-09-27 MED ORDER — LISINOPRIL 40 MG PO TABS: ORAL_TABLET | ORAL | 2 refills | Status: DC

## 2020-10-10 MED FILL — AMLODIPINE BESYLATE 10 MG T: 10 | 90 days supply | Qty: 90 | Fill #1

## 2020-10-10 MED FILL — SILDENAFIL CITRATE 20 MG TA: 20 | 20 days supply | Qty: 50 | Fill #1

## 2020-11-03 MED FILL — ATORVASTATIN CALCIUM 20 MG: 20 | 84 days supply | Qty: 36 | Fill #1

## 2020-11-16 NOTE — Patient Instructions (Incomplete)
Please stop by lab before you go If you have mychart- we will send your results within 3 business days of [place] receiving them.  If you do not have mychart- we will call you about results within 5 business days of [place] receiving them.  *please also note that you will see labs on mychart as soon as they post. I will later go in and write notes on them- will say "notes from Dr. Yong Channel"  We will call you within two weeks about your referral to GI. If you do not hear within 2 weeks, give [place] a call.   Health Maintenance Due  Topic Date Due  . OPHTHALMOLOGY EXAM  09/16/2019  . FOOT EXAM  03/02/2020  . COVID-19 Vaccine (3 - Booster for Pfizer series) 03/29/2020  . INFLUENZA VACCINE  04/03/2020

## 2020-11-16 NOTE — Progress Notes (Deleted)
Phone: [phone]   Subjective:  Patient presents today for their annual physical. Chief complaint-noted.   See problem oriented charting- ROS- full  review of systems was completed and negative  except for: ***  The following were reviewed and entered/updated in epic: Past Medical History:  Diagnosis Date  . Acne vulgaris   . Chicken pox   . Diabetes mellitus   . Hyperlipidemia   . Hypertension    Patient Active Problem List   Diagnosis Date Noted  . Paroxysmal SVT (supraventricular tachycardia) ([place]) 01/12/2020  . Thoracic aortic aneurysm without rupture ([place]) 01/12/2020  . Hypertension associated with diabetes ([place]) 03/03/2019  . Erectile dysfunction 07/10/2018  . Hidradenitis suppurativa 02/21/2018  . Palpitations 01/24/2018  . Tinea versicolor 03/20/2017  . Smoker 10/02/2016  . Diabetes mellitus type II, controlled ([place]) 05/14/2008  . Sebaceous cyst 11/28/2007  . Hyperlipidemia associated with type 2 diabetes mellitus ([place]) 06/17/2007  . Resistant hypertension 04/29/2007   Past Surgical History:  Procedure Laterality Date  . PILONIDAL CYST EXCISION     removal    Family History  Problem Relation Age of Onset  . Hyperlipidemia Unknown   . Aneurysm Mother        brain. passed 2003. complained of HA one day and passed next day  . Diabetes Mother   . Other Father        does not talk much- not a good relationship. unknown  . CVA Maternal Grandmother        x5  . Alcohol abuse Maternal Grandmother   . Hypertension Maternal Grandfather     Medications- reviewed and updated Current Outpatient Medications  Medication Sig Dispense Refill  . amLODipine (NORVASC) 10 MG tablet TAKE 1 TABLET BY MOUTH ONCE A DAY 90 tablet 3  . aspirin EC 81 MG tablet Take 81 mg by mouth daily.    Marland Kitchen atorvastatin (LIPITOR) 20 MG tablet TAKE 1 TABLET (20 MG TOTAL) BY MOUTH 3 (THREE) TIMES A WEEK. 36 tablet 3  . BYSTOLIC 10 MG tablet TAKE 1 TABLET BY MOUTH DAILY. 90 tablet 1  .  chlorthalidone (HYGROTON) 25 MG tablet TAKE 1 TABLET BY MOUTH ONCE DAILY 90 tablet 1  . ciclopirox (LOPROX) 0.77 % cream Apply topically 2 (two) times daily as needed.    . clindamycin (CLINDAGEL) 1 % gel Apply topically 2 (two) times daily. To boils/pimples in axilla and groin for up to 7 days 60 g 2  . ketoconazole (NIZORAL) 2 % cream Apply 1 application topically daily. For tinea versicolor 60 g 1  . lisinopril (ZESTRIL) 40 MG tablet TAKE 1 TABLET BY MOUTH ONCE DAILY (NEED APPT) 90 tablet 2  . sildenafil (REVATIO) 20 MG tablet TAKE 2 TO 5 TABLETS BY MOUTH AS NEEDED ONCE EVERY 48 HOURS FOR ERECTILE DYSFUNCTION 50 tablet 1  . spironolactone (ALDACTONE) 25 MG tablet TAKE 1/2 TO 1 TABLET BY MOUTH DAILY 30 tablet 5   No current facility-administered medications for this visit.    Allergies-reviewed and updated No Known Allergies  Social History   Social History Narrative   Married 2000. Step daughter and biological daughter. 23 and 17 in 2018- HS senior [employer].    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    College at [employer] finished.       Hobbies: travel- [place]   Objective  Objective:  There were no vitals taken for this visit. Gen: NAD, resting comfortably HEENT: Mucous  membranes are moist. Oropharynx normal Neck: no thyromegaly CV: RRR no murmurs rubs or gallops Lungs: CTAB no crackles, wheeze, rhonchi Abdomen: soft/nontender/nondistended/normal bowel sounds. No rebound or guarding.  Ext: no edema Skin: warm, dry Neuro: grossly normal, moves all extremities, PERRLA ***   Assessment and Plan  50 y.o. male presenting for annual physical.  Health Maintenance counseling: 1. Anticipatory guidance: Patient counseled regarding regular dental exams ***q6 months, eye exams ***yearly,  avoiding smoking and second hand smoke*** , limiting alcohol to 2 beverages per day ***.   2. Risk factor reduction:  Advised patient of  need for regular exercise and diet rich and fruits and vegetables to reduce risk of heart attack and stroke. Exercise- ***. Diet-***.  Wt Readings from Last 3 Encounters:  12/02/19 272 lb (123.4 kg)  11/10/19 271 lb 9.6 oz (123.2 kg)  08/26/19 280 lb (127 kg)   3. Immunizations/screenings/ancillary studies Immunization History  Administered Date(s) Administered  . Influenza,inj,Quad PF,6+ Mos 06/18/2019  . Influenza-Unspecified 06/20/2016, 05/07/2018  . PFIZER(Purple Top)SARS-COV-2 Vaccination 09/12/2019, 09/30/2019  . Pneumococcal Polysaccharide-23 04/29/2017  . Tdap 03/20/2017   Health Maintenance Due  Topic Date Due  . Hepatitis C Screening  Never done  . COLONOSCOPY (Pts 45-73yrs Insurance coverage will need to be confirmed)  Never done  . OPHTHALMOLOGY EXAM  09/16/2019  . FOOT EXAM  03/02/2020  . COVID-19 Vaccine (3 - Booster for Pfizer series) 03/29/2020  . INFLUENZA VACCINE  04/03/2020  . HEMOGLOBIN A1C  05/12/2020   4. Prostate cancer screening- ***  Lab Results  Component Value Date   PSA 1.44 03/03/2019   PSA 1.31 02/21/2018   PSA 1.67 04/04/2017   5. Colon cancer screening - *** 6. Skin cancer screening- ***advised regular sunscreen use. Denies worrisome, changing, or new skin lesions.  7. *** smoker 8. STD screening - ***  Status of chronic or acute concerns  #hypertension S: medication: Spironolactone 25Mg , lisinopril 40Mg , Chlorthalidone 25Mg , Amlodipine10mg  Home readings #s: *** BP Readings from Last 3 Encounters:  12/02/19 140/87  11/10/19 (!) 142/92  08/26/19 138/82  A/P: ***  #hyperlipidemia S: Medication: Atorvastatin 20Mg  Lab Results  Component Value Date   CHOL 173 03/03/2019   HDL 46.40 03/03/2019   LDLCALC 102 (H) 07/16/2008   LDLDIRECT 70.0 11/10/2019   TRIG 276.0 (H) 03/03/2019   CHOLHDL 4 03/03/2019   A/P: ***  # Diabetes S: Medication:***  CBGs- *** Exercise and diet- *** Lab Results  Component Value Date   HGBA1C 6.4  11/10/2019   HGBA1C 6.2 03/03/2019   HGBA1C 6.4 07/10/2018    A/P: ***   No specialty comments available. *** Preventative health care  Recommended follow up: ***No follow-ups on file. Future Appointments  Date Time Provider [place]  11/21/2020 10:00 AM Marin Olp, MD [facility]    No chief complaint on file.  Lab/Order associations:*** fasting   ICD-10-CM   1. Preventative health care  Z00.00     No orders of the defined types were placed in this encounter.   Return precautions advised.  Clyde Lundborg, CMA

## 2020-11-21 ENCOUNTER — Encounter: Payer: No Typology Code available for payment source | Admitting: Family Medicine

## 2020-11-21 DIAGNOSIS — Z0000 Encounter for general adult medical examination without abnormal findings: Secondary | ICD-10-CM

## 2020-11-21 DIAGNOSIS — E119 Type 2 diabetes mellitus without complications: Secondary | ICD-10-CM

## 2020-11-21 DIAGNOSIS — E1159 Type 2 diabetes mellitus with other circulatory complications: Secondary | ICD-10-CM

## 2020-11-21 DIAGNOSIS — Z1159 Encounter for screening for other viral diseases: Secondary | ICD-10-CM

## 2020-11-21 DIAGNOSIS — E1169 Type 2 diabetes mellitus with other specified complication: Secondary | ICD-10-CM

## 2020-11-21 DIAGNOSIS — Z1211 Encounter for screening for malignant neoplasm of colon: Secondary | ICD-10-CM

## 2020-12-05 ENCOUNTER — Other Ambulatory Visit (HOSPITAL_COMMUNITY): Payer: Self-pay

## 2020-12-05 MED FILL — Lisinopril Tab 40 MG: ORAL | 90 days supply | Qty: 90 | Fill #0 | Status: AC

## 2020-12-07 ENCOUNTER — Other Ambulatory Visit (HOSPITAL_COMMUNITY): Payer: Self-pay

## 2020-12-15 ENCOUNTER — Other Ambulatory Visit: Payer: Self-pay | Admitting: Family Medicine

## 2020-12-15 ENCOUNTER — Other Ambulatory Visit (HOSPITAL_COMMUNITY): Payer: Self-pay

## 2020-12-15 MED ORDER — CHLORTHALIDONE 25 MG PO TABS
ORAL_TABLET | Freq: Every day | ORAL | 1 refills | Status: DC
Start: 2020-12-15 — End: 2020-12-26
  Filled 2020-12-15: qty 90, 90d supply, fill #0

## 2020-12-15 NOTE — Progress Notes (Signed)
   Covid-19 Vaccination Clinic  Name:  Carlos Patterson    MRN: [id number] DOB: 1971/02/25  12/15/2020  [name] was observed post Covid-19 immunization for 15 minutes without incident. He was provided with Vaccine Information Sheet and instruction to access the V-Safe system.   Mr. Sample was instructed to call 911 with any severe reactions post vaccine: Marland Kitchen Difficulty breathing  . Swelling of face and throat  . A fast heartbeat  . A bad rash all over body  . Dizziness and weakness   Immunizations Administered    Name Date Dose VIS Date Route   Moderna Covid-19 Booster Vaccine 08/12/2020 10:00 AM 0.25 mL 06/22/2020 Intramuscular   Manufacturer: Moderna   Lot: [id number]   [place]: [id number]

## 2020-12-22 NOTE — Progress Notes (Signed)
Phone: [phone]   Subjective:  Patient presents today for their annual physical. Chief complaint-noted.   See problem oriented charting- ROS- full  review of systems was completed and negative  except for: palpitations, some increased urination- recently on cruise (hopefully diabetes well controlled)- slightly better since getting home yesterday  The following were reviewed and entered/updated in epic: Past Medical History:  Diagnosis Date  . Acne vulgaris   . Chicken pox   . Diabetes mellitus   . Hyperlipidemia   . Hypertension    Patient Active Problem List   Diagnosis Date Noted  . Thoracic aortic aneurysm without rupture ([place]) 01/12/2020    Priority: High  . Smoker 10/02/2016    Priority: High  . Diabetes mellitus type II, controlled ([place]) 05/14/2008    Priority: High  . Resistant hypertension 04/29/2007    Priority: High  . Aortic atherosclerosis ([place]) 12/26/2020    Priority: Medium  . Hypertension associated with diabetes ([place]) 03/03/2019    Priority: Medium  . Palpitations 01/24/2018    Priority: Medium  . Hyperlipidemia associated with type 2 diabetes mellitus ([place]) 06/17/2007    Priority: Medium  . Erectile dysfunction 07/10/2018    Priority: Low  . Hidradenitis suppurativa 02/21/2018    Priority: Low  . Tinea versicolor 03/20/2017    Priority: Low  . Sebaceous cyst 11/28/2007    Priority: Low  . Paroxysmal SVT (supraventricular tachycardia) ([place]) 01/12/2020   Past Surgical History:  Procedure Laterality Date  . PILONIDAL CYST EXCISION     removal    Family History  Problem Relation Age of Onset  . Hyperlipidemia Other   . Aneurysm Mother        brain. passed 2003. complained of HA one day and passed next day  . Diabetes Mother   . Other Father        does not talk much- not a good relationship. unknown  . CVA Maternal Grandmother        x5  . Alcohol abuse Maternal Grandmother   . Hypertension Maternal Grandfather     Medications-  reviewed and updated Current Outpatient Medications  Medication Sig Dispense Refill  . amLODipine (NORVASC) 10 MG tablet TAKE 1 TABLET BY MOUTH ONCE A DAY 90 tablet 3  . aspirin EC 81 MG tablet Take 81 mg by mouth daily.    Marland Kitchen atorvastatin (LIPITOR) 20 MG tablet TAKE 1 TABLET BY MOUTH 3 TIMES A WEEK. 36 tablet 3  . ciclopirox (LOPROX) 0.77 % cream Apply topically 2 (two) times daily as needed.    . clindamycin (CLINDAGEL) 1 % gel Apply topically 2 (two) times daily. To boils/pimples in axilla and groin for up to 7 days 60 g 2  . ketoconazole (NIZORAL) 2 % cream Apply 1 application topically daily. For tinea versicolor 60 g 1  . lisinopril (ZESTRIL) 40 MG tablet TAKE 1 TABLET BY MOUTH ONCE DAILY (NEED APPT) 90 tablet 2  . nebivolol (BYSTOLIC) 10 MG tablet TAKE 1 TABLET BY MOUTH DAILY. 90 tablet 1  . sildenafil (REVATIO) 20 MG tablet TAKE 2 TO 5 TABLETS BY MOUTH AS NEEDED ONCE EVERY 48 HOURS FOR ERECTILE DYSFUNCTION 50 tablet 1  . spironolactone (ALDACTONE) 25 MG tablet TAKE 1/2-1 TABLET BY MOUTH ONCE A DAY 30 tablet 5  . chlorthalidone (HYGROTON) 25 MG tablet TAKE 1 TABLET BY MOUTH ONCE DAILY 90 tablet 3   No current facility-administered medications for this visit.    Allergies-reviewed and updated No Known Allergies  Social History  Social History Narrative   Married 2000. Step daughter and biological daughter. 23 and 17 in 2018- HS senior [employer].    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    College at [employer] finished.       Hobbies: [place]   Objective  Objective:  BP (!) 154/92   Pulse 72   Temp 97.7 F (36.5 C) (Temporal)   Ht 6\' 7"  (2.007 m)   Wt 280 lb 12.8 oz (127.4 kg)   SpO2 96%   BMI 31.63 kg/m  Gen: NAD, resting comfortably HEENT: Mucous membranes are moist. Oropharynx normal Neck: no thyromegaly CV: RRR no murmurs rubs or gallops Lungs: CTAB no crackles, wheeze, rhonchi Abdomen:  soft/nontender/nondistended/normal bowel sounds. No rebound or guarding.  Ext: no edema Skin: warm, dry Neuro: grossly normal, moves all extremities, PERRLA  Diabetic Foot Exam - Simple   Simple Foot Form Diabetic Foot exam was performed with the following findings: Yes 12/26/2020 10:37 AM  Visual Inspection No deformities, no ulcerations, no other skin breakdown bilaterally: Yes Sensation Testing Intact to touch and monofilament testing bilaterally: Yes Pulse Check Posterior Tibialis and Dorsalis pulse intact bilaterally: Yes Comments       Assessment and Plan  50 y.o. male presenting for annual physical.  Health Maintenance counseling: 1. Anticipatory guidance: Patient counseled regarding regular dental exams - advised q6 months, eye exams - needs to update this- yearly ideally,  avoiding smoking and second hand smoke- see below , limiting alcohol to 2 beverages per day - other than his cruise.   2. Risk factor reduction:  Advised patient of need for regular exercise and diet rich and fruits and vegetables to reduce risk of heart attack and stroke. Exercise- needs to restart this. Diet- Weight up 8 lbs but literally just got back from a cruise, felt like he had not been doing well lately- feels motivated to make change Wt Readings from Last 3 Encounters:  12/26/20 280 lb 12.8 oz (127.4 kg)  12/02/19 272 lb (123.4 kg)  11/10/19 271 lb 9.6 oz (123.2 kg)  3. Immunizations/screenings/ancillary studies Immunization History  Administered Date(s) Administered  . Influenza,inj,Quad PF,6+ Mos 06/18/2019  . Influenza-Unspecified 06/20/2016, 05/07/2018  . Moderna SARS-COV2 Booster Vaccination 08/12/2020  . PFIZER(Purple Top)SARS-COV-2 Vaccination 09/12/2019, 09/30/2019  . Pneumococcal Polysaccharide-23 04/29/2017  . Tdap 03/20/2017   4. Prostate cancer screening- low risk prior trend- update with labs today due to being african american  Lab Results  Component Value Date   PSA 1.44  03/03/2019   PSA 1.31 02/21/2018   PSA 1.67 04/04/2017   5. Colon cancer screening - refer today- has not previously had colonoscopy 6. Skin cancer screening- lower risk due to melanin content. advised regular sunscreen use. Denies worrisome, changing, or new skin lesions.  7. current smoker- about half pack a day recently- we discussed importance of cessation- one of the best things he can do for his health. Wants to try unassisted 8. STD screening - only active with wife so not needed  Status of chronic or acute concerns   #social update- just got back from [place] in [place]  #hyperlipidemia/aortic atherosclerosis S: Medication: atorvastatin 20mg  3x a week Lab Results  Component Value Date   CHOL 173 03/03/2019   HDL 46.40 03/03/2019   LDLCALC 102 (H) 07/16/2008   LDLDIRECT 70.0 11/10/2019   TRIG 276.0 (H) 03/03/2019   CHOLHDL 4 03/03/2019   A/P: with  aortic atherosclerosis- hopefully LDL 70 or less- as long as under 100 unlikely to change meds- update lipids today  # Diabetes S: Medication: none, diet controlled in past  CBGs- does not check Exercise and diet- see above Lab Results  Component Value Date   HGBA1C 6.4 11/10/2019   HGBA1C 6.2 03/03/2019   HGBA1C 6.4 07/10/2018    A/P: hopefully controlled- update a1c- sounds like he has space in lifestyle to work on this over starting medicine I would say as long as a1c under 7.5  #hypertension S: medication: Spironolactone 25mg , Bystolic 10Mg , lisinopril 40mg , chlorthalidone 25Mg  - out of chlorthalidone for about a week BP Readings from Last 3 Encounters:  12/26/20 (!) 154/92  12/02/19 140/87  11/10/19 (!) 142/92  A/P: blood pressure mildly high- asked him to let me know how blood pressure is doing a week after restarting the chlorthalidone on mychart  #Thoracic aortic aneurysm-this was noted on echocardiogram September 09, 2019-on follow-up CT angiogram 06/08/20 this was not noted-I am going to wait until he  has a follow-up cardiology visit before taking this off of his list but I lean toward removing this with last CT angiogram being reassuring  # Palpitations- worse if dehydrated. history of SVT but no recent issues  Recommended follow up: Return in about 6 months (around 06/27/2021) for follow up- or sooner if needed.  Lab/Order associations: fasting   ICD-10-CM   1. Preventative health care  Z00.00 Comprehensive metabolic panel    CBC with Differential/Platelet    Lipid panel    Hemoglobin A1c  2. Hypertension associated with diabetes ([place])  E11.59    I15.2   3. Hyperlipidemia associated with type 2 diabetes mellitus ([facility])  E11.69 Comprehensive metabolic panel   X83.3 CBC with Differential/Platelet    Lipid panel    POCT Urinalysis Dipstick (Automated)  4. Controlled type 2 diabetes mellitus without complication, without long-term current use of insulin ([facility])  E11.9 Comprehensive metabolic panel    CBC with Differential/Platelet    Lipid panel    Hemoglobin A1c    POCT Urinalysis Dipstick (Automated)  5. Encounter for screening colonoscopy  Z12.11 Ambulatory referral to Gastroenterology  6. Encounter for hepatitis C screening test for low risk patient  Z11.59 Hepatitis C antibody  7. Screening for prostate cancer  Z12.5 PSA  8. Aortic atherosclerosis ([facility])  I70.0   9. Smoker  F17.200   10. Resistant hypertension  I10     Meds ordered this encounter  Medications  . chlorthalidone (HYGROTON) 25 MG tablet    Sig: TAKE 1 TABLET BY MOUTH ONCE DAILY    Dispense:  90 tablet    Refill:  3    Return precautions advised.  Garret Reddish, MD

## 2020-12-22 NOTE — Patient Instructions (Addendum)
Please stop by lab before you go If you have mychart- we will send your results within 3 business days of [place] receiving them.  If you do not have mychart- we will call you about results within 5 business days of [place] receiving them.  *please also note that you will see labs on mychart as soon as they post. I will later go in and write notes on them- will say "notes from Dr. Yong Channel"  Health Maintenance Due  Topic Date Due  . COLONOSCOPY- We will call you within two weeks about your referral to GI for colonoscopy. If you do not hear within 2 weeks, give [place] a call.   Never done  . OPHTHALMOLOGY EXAM Needs to schedule.  09/16/2019   blood pressure mildly high- asked him to let me know how blood pressure is doing a week after restarting the chlorthalidone on mychart  Recommended follow up: Return in about 6 months (around 06/27/2021) for follow up- or sooner if needed.

## 2020-12-23 ENCOUNTER — Other Ambulatory Visit (HOSPITAL_COMMUNITY): Payer: Self-pay

## 2020-12-26 ENCOUNTER — Ambulatory Visit (INDEPENDENT_AMBULATORY_CARE_PROVIDER_SITE_OTHER): Payer: No Typology Code available for payment source | Admitting: Family Medicine

## 2020-12-26 ENCOUNTER — Other Ambulatory Visit: Payer: Self-pay

## 2020-12-26 ENCOUNTER — Encounter: Payer: Self-pay | Admitting: Family Medicine

## 2020-12-26 ENCOUNTER — Other Ambulatory Visit (HOSPITAL_COMMUNITY): Payer: Self-pay

## 2020-12-26 VITALS — BP 154/92 | HR 72 | Temp 97.7°F | Ht 79.0 in | Wt 280.8 lb

## 2020-12-26 DIAGNOSIS — E119 Type 2 diabetes mellitus without complications: Secondary | ICD-10-CM | POA: Diagnosis not present

## 2020-12-26 DIAGNOSIS — I700 Atherosclerosis of aorta: Secondary | ICD-10-CM | POA: Insufficient documentation

## 2020-12-26 DIAGNOSIS — E785 Hyperlipidemia, unspecified: Secondary | ICD-10-CM | POA: Diagnosis not present

## 2020-12-26 DIAGNOSIS — I471 Supraventricular tachycardia: Secondary | ICD-10-CM

## 2020-12-26 DIAGNOSIS — Z125 Encounter for screening for malignant neoplasm of prostate: Secondary | ICD-10-CM | POA: Diagnosis not present

## 2020-12-26 DIAGNOSIS — E1169 Type 2 diabetes mellitus with other specified complication: Secondary | ICD-10-CM

## 2020-12-26 DIAGNOSIS — Z0000 Encounter for general adult medical examination without abnormal findings: Secondary | ICD-10-CM | POA: Diagnosis not present

## 2020-12-26 DIAGNOSIS — Z1159 Encounter for screening for other viral diseases: Secondary | ICD-10-CM

## 2020-12-26 DIAGNOSIS — E1159 Type 2 diabetes mellitus with other circulatory complications: Secondary | ICD-10-CM | POA: Diagnosis not present

## 2020-12-26 DIAGNOSIS — I10 Essential (primary) hypertension: Secondary | ICD-10-CM

## 2020-12-26 DIAGNOSIS — Z1211 Encounter for screening for malignant neoplasm of colon: Secondary | ICD-10-CM

## 2020-12-26 DIAGNOSIS — F17200 Nicotine dependence, unspecified, uncomplicated: Secondary | ICD-10-CM

## 2020-12-26 DIAGNOSIS — I152 Hypertension secondary to endocrine disorders: Secondary | ICD-10-CM

## 2020-12-26 LAB — COMPREHENSIVE METABOLIC PANEL
ALT: 26 U/L (ref 0–53)
AST: 21 U/L (ref 0–37)
Albumin: 4.5 g/dL (ref 3.5–5.2)
Alkaline Phosphatase: 89 U/L (ref 39–117)
BUN: 15 mg/dL (ref 6–23)
CO2: 25 mEq/L (ref 19–32)
Calcium: 10.1 mg/dL (ref 8.4–10.5)
Chloride: 102 mEq/L (ref 96–112)
Creatinine, Ser: 0.93 mg/dL (ref 0.40–1.50)
GFR: 94.89 mL/min (ref >60.00)
Glucose, Bld: 136 mg/dL — ABNORMAL HIGH (ref 70–99)
Potassium: 3.9 mEq/L (ref 3.5–5.1)
Sodium: 136 mEq/L (ref 135–145)
Total Bilirubin: 0.7 mg/dL (ref 0.2–1.2)
Total Protein: 7.6 g/dL (ref 6.0–8.3)

## 2020-12-26 LAB — POC URINALSYSI DIPSTICK (AUTOMATED)
Bilirubin, UA: NEGATIVE
Blood, UA: NEGATIVE
Glucose, UA: NEGATIVE
Ketones, UA: NEGATIVE
Leukocytes, UA: NEGATIVE
Nitrite, UA: NEGATIVE
Protein, UA: NEGATIVE
Spec Grav, UA: 1.015 (ref 1.010–1.025)
Urobilinogen, UA: 0.2 E.U./dL
pH, UA: 6 (ref 5.0–8.0)

## 2020-12-26 LAB — CBC WITH DIFFERENTIAL/PLATELET
Basophils Absolute: 0 10*3/uL (ref 0.0–0.1)
Basophils Relative: 0.2 % (ref 0.0–3.0)
Eosinophils Absolute: 0.2 10*3/uL (ref 0.0–0.7)
Eosinophils Relative: 2.5 % (ref 0.0–5.0)
HCT: 43.4 % (ref 39.0–52.0)
Hemoglobin: 15 g/dL (ref 13.0–17.0)
Lymphocytes Relative: 31.9 % (ref 12.0–46.0)
Lymphs Abs: 2.7 10*3/uL (ref 0.7–4.0)
MCHC: 34.5 g/dL (ref 30.0–36.0)
MCV: 90.9 fl (ref 78.0–100.0)
Monocytes Absolute: 0.7 10*3/uL (ref 0.1–1.0)
Monocytes Relative: 7.7 % (ref 3.0–12.0)
Neutro Abs: 4.9 10*3/uL (ref 1.4–7.7)
Neutrophils Relative %: 57.7 % (ref 43.0–77.0)
Platelets: 249 10*3/uL (ref 150.0–400.0)
RBC: 4.78 Mil/uL (ref 4.22–5.81)
RDW: 13.9 % (ref 11.5–15.5)
WBC: 8.5 10*3/uL (ref 4.0–10.5)

## 2020-12-26 LAB — LIPID PANEL
Cholesterol: 177 mg/dL (ref 0–200)
HDL: 46.9 mg/dL (ref >39.00)
NonHDL: 129.83
Total CHOL/HDL Ratio: 4
Triglycerides: 232 mg/dL — ABNORMAL HIGH (ref 0.0–149.0)
VLDL: 46.4 mg/dL — ABNORMAL HIGH (ref 0.0–40.0)

## 2020-12-26 LAB — LDL CHOLESTEROL, DIRECT: Direct LDL: 107 mg/dL

## 2020-12-26 LAB — HEMOGLOBIN A1C: Hgb A1c MFr Bld: 6.7 % — ABNORMAL HIGH (ref 4.6–6.5)

## 2020-12-26 LAB — PSA: PSA: 1.25 ng/mL (ref 0.10–4.00)

## 2020-12-26 MED ORDER — CHLORTHALIDONE 25 MG PO TABS
ORAL_TABLET | Freq: Every day | ORAL | 3 refills | Status: DC
  Filled 2020-12-26: qty 90, 90d supply, fill #0
  Filled 2021-04-02: qty 90, 90d supply, fill #1

## 2020-12-26 NOTE — Addendum Note (Signed)
Addended by: Brandy Hale on: 12/26/2020 11:01 AM   Modules accepted: Orders

## 2020-12-27 ENCOUNTER — Other Ambulatory Visit (HOSPITAL_COMMUNITY): Payer: Self-pay

## 2020-12-27 ENCOUNTER — Other Ambulatory Visit: Payer: Self-pay

## 2020-12-27 LAB — HEPATITIS C ANTIBODY
Hepatitis C Ab: NONREACTIVE
SIGNAL TO CUT-OFF: 0.05 (ref <1.00)

## 2020-12-27 MED ORDER — ROSUVASTATIN CALCIUM 20 MG PO TABS
20.0000 mg | ORAL_TABLET | ORAL | 3 refills | Status: DC
  Filled 2020-12-27: qty 36, 84d supply, fill #0
  Filled 2021-03-22: qty 36, 84d supply, fill #1
  Filled 2021-06-27: qty 36, 84d supply, fill #2
  Filled 2021-10-03: qty 36, 84d supply, fill #3

## 2020-12-31 ENCOUNTER — Other Ambulatory Visit: Payer: Self-pay | Admitting: Family Medicine

## 2021-01-02 ENCOUNTER — Other Ambulatory Visit (HOSPITAL_COMMUNITY): Payer: Self-pay

## 2021-01-02 MED ORDER — SILDENAFIL CITRATE 20 MG PO TABS
ORAL_TABLET | ORAL | 1 refills | Status: DC
  Filled 2021-01-02: qty 50, 30d supply, fill #0
  Filled 2021-03-22: qty 50, 20d supply, fill #1

## 2021-01-10 ENCOUNTER — Other Ambulatory Visit: Payer: Self-pay

## 2021-01-10 ENCOUNTER — Emergency Department (HOSPITAL_BASED_OUTPATIENT_CLINIC_OR_DEPARTMENT_OTHER)
Admission: EM | Admit: 2021-01-10 | Discharge: 2021-01-10 | Disposition: A | Discharge status: Home / Self Care | Payer: No Typology Code available for payment source | Attending: Emergency Medicine | Admitting: Emergency Medicine

## 2021-01-10 DIAGNOSIS — Z7982 Long term (current) use of aspirin: Secondary | ICD-10-CM | POA: Insufficient documentation

## 2021-01-10 DIAGNOSIS — Z2914 Encounter for prophylactic rabies immune globin: Secondary | ICD-10-CM | POA: Diagnosis not present

## 2021-01-10 DIAGNOSIS — I10 Essential (primary) hypertension: Secondary | ICD-10-CM | POA: Diagnosis not present

## 2021-01-10 DIAGNOSIS — Z79899 Other long term (current) drug therapy: Secondary | ICD-10-CM | POA: Diagnosis not present

## 2021-01-10 DIAGNOSIS — F17210 Nicotine dependence, cigarettes, uncomplicated: Secondary | ICD-10-CM | POA: Diagnosis not present

## 2021-01-10 DIAGNOSIS — Z203 Contact with and (suspected) exposure to rabies: Secondary | ICD-10-CM

## 2021-01-10 DIAGNOSIS — E119 Type 2 diabetes mellitus without complications: Secondary | ICD-10-CM | POA: Diagnosis not present

## 2021-01-10 DIAGNOSIS — Z23 Encounter for immunization: Secondary | ICD-10-CM | POA: Insufficient documentation

## 2021-01-10 MED ORDER — RABIES IMMUNE GLOBULIN 150 UNIT/ML IM INJ
20.0000 [IU]/kg | INJECTION | Freq: Once | INTRAMUSCULAR | Status: AC
  Administered 2021-01-10: 2550 [IU] via INTRAMUSCULAR
  Filled 2021-01-10: qty 18

## 2021-01-10 MED ORDER — RABIES VACCINE, PCEC IM SUSR
1.0000 mL | Freq: Once | INTRAMUSCULAR | Status: AC
  Administered 2021-01-10: 1 mL via INTRAMUSCULAR
  Filled 2021-01-10: qty 1

## 2021-01-10 NOTE — Discharge Instructions (Addendum)
You will need repeat rabies vaccination on 5/13, 5/17, 5/24.

## 2021-01-10 NOTE — ED Triage Notes (Signed)
Thursday Pt was exposed to a bat in his house and is requesting rabies vaccine. Pt was never bitten just exposure.

## 2021-01-10 NOTE — ED Provider Notes (Signed)
[facility] EMERGENCY DEPT Provider Note   CSN: [id number] Arrival date & time: 01/10/21  1120     History Chief Complaint  Patient presents with  . Rabies Injection    Carlos Patterson is a 50 y.o. male.  He is here for possible rabies exposure.  Family found a bat in the house in the living room where the patient sleeps.  No known bite.  No complaints.  Unfortunately bat had escaped and unable to be tested.  The history is provided by the patient.  Animal Bite Contact animal:  Bat Animal bite location: no known bite. Pain details:    Severity:  No pain Incident location:  Home Notifications:  None Animal's rabies vaccination status:  Unknown Animal in possession: no   Relieved by:  None tried Worsened by:  Nothing Ineffective treatments:  None tried Associated symptoms: no fever        Past Medical History:  Diagnosis Date  . Acne vulgaris   . Chicken pox   . Diabetes mellitus   . Hyperlipidemia   . Hypertension     Patient Active Problem List   Diagnosis Date Noted  . Aortic atherosclerosis ([place]) 12/26/2020  . Paroxysmal SVT (supraventricular tachycardia) ([place]) 01/12/2020  . Thoracic aortic aneurysm without rupture ([place]) 01/12/2020  . Hypertension associated with diabetes ([place]) 03/03/2019  . Erectile dysfunction 07/10/2018  . Hidradenitis suppurativa 02/21/2018  . Palpitations 01/24/2018  . Tinea versicolor 03/20/2017  . Smoker 10/02/2016  . Diabetes mellitus type II, controlled ([place]) 05/14/2008  . Sebaceous cyst 11/28/2007  . Hyperlipidemia associated with type 2 diabetes mellitus ([place]) 06/17/2007  . Resistant hypertension 04/29/2007    Past Surgical History:  Procedure Laterality Date  . PILONIDAL CYST EXCISION     removal       Family History  Problem Relation Age of Onset  . Hyperlipidemia Other   . Aneurysm Mother        brain. passed 2003. complained of HA one day and passed next day  . Diabetes Mother   . Other Father         does not talk much- not a good relationship. unknown  . CVA Maternal Grandmother        x5  . Alcohol abuse Maternal Grandmother   . Hypertension Maternal Grandfather     Social History   Tobacco Use  . Smoking status: Current Every Day Smoker    Packs/day: 0.50    Types: Cigarettes  . Smokeless tobacco: Never Used  Substance Use Topics  . Alcohol use: Yes    Alcohol/week: 1.0 - 2.0 standard drink    Types: 1 - 2 Glasses of wine per week  . Drug use: Yes    Types: Marijuana    Comment: sparing    Home Medications Prior to Admission medications   Medication Sig Start Date End Date Taking? Authorizing Provider  amLODipine (NORVASC) 10 MG tablet TAKE 1 TABLET BY MOUTH ONCE A DAY 06/23/20 06/23/21 Yes Marin Olp, MD  aspirin EC 81 MG tablet Take 81 mg by mouth daily.   Yes [provider]  chlorthalidone (HYGROTON) 25 MG tablet TAKE 1 TABLET BY MOUTH ONCE DAILY 12/26/20 12/26/21 Yes Marin Olp, MD  lisinopril (ZESTRIL) 40 MG tablet TAKE 1 TABLET BY MOUTH ONCE DAILY (NEED APPT) 09/27/20 09/27/21 Yes Marin Olp, MD  nebivolol (BYSTOLIC) 10 MG tablet TAKE 1 TABLET BY MOUTH DAILY. 05/24/20 05/24/21 Yes Marin Olp, MD  rosuvastatin (CRESTOR) 20 MG  tablet Take 1 tablet (20 mg total) by mouth 3 (three) times a week. 12/28/20  Yes Marin Olp, MD  sildenafil (REVATIO) 20 MG tablet TAKE 2 TO 5 TABLETS BY MOUTH AS NEEDED ONCE EVERY 48 HOURS FOR ERECTILE DYSFUNCTION 01/02/21 01/02/22 Yes Marin Olp, MD  spironolactone (ALDACTONE) 25 MG tablet TAKE 1/2-1 TABLET BY MOUTH ONCE A DAY 09/12/20 09/12/21 Yes Marin Olp, MD  ciclopirox (LOPROX) 0.77 % cream Apply topically 2 (two) times daily as needed.    [provider]  clindamycin (CLINDAGEL) 1 % gel Apply topically 2 (two) times daily. To boils/pimples in axilla and groin for up to 7 days 02/21/18   Marin Olp, MD  ketoconazole (NIZORAL) 2 % cream Apply 1 application topically daily.  For tinea versicolor 01/24/18   Marin Olp, MD    Allergies    Patient has no known allergies.  Review of Systems   Review of Systems  Constitutional: Negative for fever.  Skin: Negative for wound.    Physical Exam Updated Vital Signs BP (!) 174/107 (BP Location: Right Arm)   Pulse 72   Temp 98.5 F (36.9 C) (Oral)   Resp 17   Ht 6\' 7"  (2.007 m)   Wt 127.2 kg   SpO2 100%   BMI 31.59 kg/m   Physical Exam Vitals and nursing note reviewed.  Constitutional:      Appearance: Normal appearance. He is well-developed.  HENT:     Head: Normocephalic and atraumatic.  Eyes:     Conjunctiva/sclera: Conjunctivae normal.  Cardiovascular:     Rate and Rhythm: Normal rate and regular rhythm.  Pulmonary:     Effort: Pulmonary effort is normal.     Breath sounds: Normal breath sounds.  Musculoskeletal:     Cervical back: Neck supple.  Skin:    General: Skin is warm and dry.  Neurological:     General: No focal deficit present.     Mental Status: He is alert.     GCS: GCS eye subscore is 4. GCS verbal subscore is 5. GCS motor subscore is 6.     ED Results / Procedures / Treatments   Labs (all labs ordered are listed, but only abnormal results are displayed) Labs Reviewed - No data to display  EKG None  Radiology No results found.  Procedures Procedures   Medications Ordered in ED Medications  rabies immune globulin (HYPERAB/KEDRAB) injection 2,550 Units (has no administration in time range)  rabies vaccine (RABAVERT) injection 1 mL (has no administration in time range)    ED Course  I have reviewed the triage vital signs and the nursing notes.  Pertinent labs & imaging results that were available during my care of the patient were reviewed by me and considered in my medical decision making (see chart for details).    MDM Rules/Calculators/A&P                           Final Clinical Impression(s) / ED Diagnoses Final diagnoses:  Contact with and  (suspected) exposure to rabies    Rx / DC Orders ED Discharge Orders    None       Hayden Rasmussen, MD 01/10/21 1706

## 2021-01-13 ENCOUNTER — Other Ambulatory Visit: Payer: Self-pay

## 2021-01-13 ENCOUNTER — Ambulatory Visit (HOSPITAL_COMMUNITY)
Admission: RE | Admit: 2021-01-13 | Discharge: 2021-01-13 | Disposition: A | Discharge status: Home / Self Care | Payer: No Typology Code available for payment source | Source: Ambulatory Visit | Attending: Internal Medicine | Admitting: Internal Medicine

## 2021-01-13 DIAGNOSIS — Z203 Contact with and (suspected) exposure to rabies: Secondary | ICD-10-CM | POA: Diagnosis not present

## 2021-01-13 MED ORDER — RABIES VACCINE, PCEC IM SUSR
INTRAMUSCULAR | Status: AC
  Filled 2021-01-13: qty 1

## 2021-01-13 MED ORDER — RABIES VACCINE, PCEC IM SUSR
1.0000 mL | Freq: Once | INTRAMUSCULAR | Status: AC
  Administered 2021-01-13: 1 mL via INTRAMUSCULAR

## 2021-01-13 NOTE — ED Triage Notes (Signed)
Pt presents for 2nd rabies injection with no complaints.

## 2021-01-14 ENCOUNTER — Other Ambulatory Visit (HOSPITAL_COMMUNITY): Payer: Self-pay

## 2021-01-14 ENCOUNTER — Other Ambulatory Visit: Payer: Self-pay | Admitting: Family Medicine

## 2021-01-14 MED ORDER — NEBIVOLOL HCL 10 MG PO TABS
10.0000 mg | ORAL_TABLET | Freq: Every day | ORAL | 1 refills | Status: DC
  Filled 2021-01-14: qty 90, 90d supply, fill #0
  Filled 2021-04-24: qty 90, 90d supply, fill #1

## 2021-01-17 ENCOUNTER — Ambulatory Visit
Admission: RE | Admit: 2021-01-17 | Discharge: 2021-01-17 | Disposition: A | Discharge status: Home / Self Care | Payer: No Typology Code available for payment source | Source: Ambulatory Visit | Attending: Internal Medicine | Admitting: Internal Medicine

## 2021-01-17 ENCOUNTER — Ambulatory Visit: Payer: Self-pay

## 2021-01-17 ENCOUNTER — Other Ambulatory Visit: Payer: Self-pay

## 2021-01-17 DIAGNOSIS — Z203 Contact with and (suspected) exposure to rabies: Secondary | ICD-10-CM

## 2021-01-17 MED ORDER — RABIES VACCINE, PCEC IM SUSR
1.0000 mL | Freq: Once | INTRAMUSCULAR | Status: AC
  Administered 2021-01-17: 1 mL via INTRAMUSCULAR

## 2021-01-17 NOTE — ED Triage Notes (Signed)
Patient presents requesting rabies injection. Reports bat in the house a few weeks ago.

## 2021-01-20 ENCOUNTER — Other Ambulatory Visit (HOSPITAL_COMMUNITY): Payer: Self-pay

## 2021-01-20 MED FILL — Spironolactone Tab 25 MG: ORAL | 30 days supply | Qty: 30 | Fill #0 | Status: AC

## 2021-01-20 MED FILL — Amlodipine Besylate Tab 10 MG (Base Equivalent): ORAL | 90 days supply | Qty: 90 | Fill #0 | Status: AC

## 2021-01-24 ENCOUNTER — Other Ambulatory Visit: Payer: Self-pay

## 2021-01-24 ENCOUNTER — Ambulatory Visit
Admission: RE | Admit: 2021-01-24 | Discharge: 2021-01-24 | Disposition: A | Discharge status: Home / Self Care | Payer: No Typology Code available for payment source | Source: Ambulatory Visit | Attending: Emergency Medicine | Admitting: Emergency Medicine

## 2021-01-24 DIAGNOSIS — Z203 Contact with and (suspected) exposure to rabies: Secondary | ICD-10-CM | POA: Diagnosis not present

## 2021-01-24 MED ORDER — RABIES VACCINE, PCEC IM SUSR
1.0000 mL | Freq: Once | INTRAMUSCULAR | Status: AC
  Administered 2021-01-24: 1 mL via INTRAMUSCULAR

## 2021-01-24 NOTE — ED Triage Notes (Signed)
Pt here for last rabies injection 

## 2021-03-22 ENCOUNTER — Other Ambulatory Visit (HOSPITAL_COMMUNITY): Payer: Self-pay

## 2021-03-22 MED FILL — Lisinopril Tab 40 MG: ORAL | 90 days supply | Qty: 90 | Fill #1 | Status: AC

## 2021-04-02 MED FILL — Spironolactone Tab 25 MG: ORAL | 30 days supply | Qty: 30 | Fill #1 | Status: AC

## 2021-04-03 ENCOUNTER — Other Ambulatory Visit (HOSPITAL_COMMUNITY): Payer: Self-pay

## 2021-04-12 ENCOUNTER — Ambulatory Visit (HOSPITAL_BASED_OUTPATIENT_CLINIC_OR_DEPARTMENT_OTHER): Payer: No Typology Code available for payment source | Admitting: Cardiology

## 2021-04-24 ENCOUNTER — Other Ambulatory Visit: Payer: Self-pay | Admitting: Family Medicine

## 2021-04-24 ENCOUNTER — Other Ambulatory Visit (HOSPITAL_COMMUNITY): Payer: Self-pay

## 2021-04-24 MED ORDER — SILDENAFIL CITRATE 20 MG PO TABS
ORAL_TABLET | ORAL | 1 refills | Status: DC
  Filled 2021-04-24: qty 50, 30d supply, fill #0
  Filled 2021-06-05: qty 50, 20d supply, fill #1

## 2021-04-25 ENCOUNTER — Other Ambulatory Visit (HOSPITAL_COMMUNITY): Payer: Self-pay

## 2021-04-29 ENCOUNTER — Other Ambulatory Visit (HOSPITAL_COMMUNITY): Payer: Self-pay

## 2021-04-29 MED FILL — Amlodipine Besylate Tab 10 MG (Base Equivalent): ORAL | 90 days supply | Qty: 90 | Fill #1 | Status: AC

## 2021-05-02 ENCOUNTER — Ambulatory Visit (HOSPITAL_BASED_OUTPATIENT_CLINIC_OR_DEPARTMENT_OTHER): Payer: No Typology Code available for payment source | Admitting: Cardiology

## 2021-06-05 ENCOUNTER — Other Ambulatory Visit (HOSPITAL_COMMUNITY): Payer: Self-pay

## 2021-06-06 ENCOUNTER — Ambulatory Visit: Payer: No Typology Code available for payment source | Attending: Family

## 2021-06-06 DIAGNOSIS — Z23 Encounter for immunization: Secondary | ICD-10-CM

## 2021-06-06 NOTE — Progress Notes (Signed)
   Covid-19 Vaccination Clinic  Name:  Carlos Patterson    MRN: [id number] DOB: 26-Mar-1971  06/06/2021  [name] was observed post Covid-19 immunization for 15 minutes without incident. He was provided with Vaccine Information Sheet and instruction to access the V-Safe system.   [name] was instructed to call 911 with any severe reactions post vaccine: Difficulty breathing  Swelling of face and throat  A fast heartbeat  A bad rash all over body  Dizziness and weakness

## 2021-06-12 ENCOUNTER — Other Ambulatory Visit (HOSPITAL_COMMUNITY): Payer: Self-pay

## 2021-06-12 MED FILL — Spironolactone Tab 25 MG: ORAL | 30 days supply | Qty: 30 | Fill #2 | Status: AC

## 2021-06-14 ENCOUNTER — Ambulatory Visit (HOSPITAL_BASED_OUTPATIENT_CLINIC_OR_DEPARTMENT_OTHER): Payer: No Typology Code available for payment source | Admitting: Cardiology

## 2021-06-19 NOTE — Progress Notes (Incomplete)
Phone [phone] In person visit   Subjective:   Carlos Patterson is a 50 y.o. year old very pleasant male patient who presents for/with See problem oriented charting No chief complaint on file.   This visit occurred during the SARS-CoV-2 public health emergency.  Safety protocols were in place, including screening questions prior to the visit, additional usage of staff PPE, and extensive cleaning of exam room while observing appropriate contact time as indicated for disinfecting solutions.   Past Medical History-  Patient Active Problem List   Diagnosis Date Noted   Aortic atherosclerosis ([place]) 12/26/2020   Paroxysmal SVT (supraventricular tachycardia) ([place]) 01/12/2020   Thoracic aortic aneurysm without rupture 01/12/2020   Hypertension associated with diabetes ([place]) 03/03/2019   Erectile dysfunction 07/10/2018   Hidradenitis suppurativa 02/21/2018   Palpitations 01/24/2018   Tinea versicolor 03/20/2017   Smoker 10/02/2016   Diabetes mellitus type II, controlled ([place]) 05/14/2008   Sebaceous cyst 11/28/2007   Hyperlipidemia associated with type 2 diabetes mellitus ([place]) 06/17/2007   Resistant hypertension 04/29/2007    Medications- reviewed and updated Current Outpatient Medications  Medication Sig Dispense Refill   amLODipine (NORVASC) 10 MG tablet TAKE 1 TABLET BY MOUTH ONCE A DAY 90 tablet 3   aspirin EC 81 MG tablet Take 81 mg by mouth daily.     chlorthalidone (HYGROTON) 25 MG tablet TAKE 1 TABLET BY MOUTH ONCE DAILY 90 tablet 3   ciclopirox (LOPROX) 0.77 % cream Apply topically 2 (two) times daily as needed.     clindamycin (CLINDAGEL) 1 % gel Apply topically 2 (two) times daily. To boils/pimples in axilla and groin for up to 7 days 60 g 2   ketoconazole (NIZORAL) 2 % cream Apply 1 application topically daily. For tinea versicolor 60 g 1   lisinopril (ZESTRIL) 40 MG tablet TAKE 1 TABLET BY MOUTH ONCE DAILY (NEED APPT) 90 tablet 2   nebivolol (BYSTOLIC) 10 MG tablet  Take 1 tablet (10 mg total) by mouth daily. 90 tablet 1   rosuvastatin (CRESTOR) 20 MG tablet Take 1 tablet (20 mg total) by mouth 3 (three) times a week. 36 tablet 3   sildenafil (REVATIO) 20 MG tablet TAKE 2 TO 5 TABLETS BY MOUTH AS NEEDED ONCE EVERY 48 HOURS FOR ERECTILE DYSFUNCTION 50 tablet 1   spironolactone (ALDACTONE) 25 MG tablet TAKE 1/2-1 TABLET BY MOUTH ONCE A DAY 30 tablet 5   No current facility-administered medications for this visit.     Objective:  There were no vitals taken for this visit. Gen: NAD, resting comfortably CV: RRR no murmurs rubs or gallops Lungs: CTAB no crackles, wheeze, rhonchi Abdomen: soft/nontender/nondistended/normal bowel sounds. No rebound or guarding.  Ext: no edema Skin: warm, dry Neuro: grossly normal, moves all extremities  ***    Assessment and Plan   # ED visit for rabies injection - was seen in ED on 05/22 for possible rabies exposure. Family found a bat in the living room of the house where the patient slept. No known bites or no complaints. Bat had escaped and was unable to be tested.  Hyperab injection and Rabavert injection.   #hyperlipidemia/aortic atherosclerosis S: Medication: atorvastatin 20mg  3x a week Lab Results  Component Value Date   CHOL 177 12/26/2020   HDL 46.90 12/26/2020   LDLCALC 102 (H) 07/16/2008   LDLDIRECT 107.0 12/26/2020   TRIG 232.0 (H) 12/26/2020   CHOLHDL 4 12/26/2020   A/P: ***  # Diabetes S: Medication: none, diet controlled in past  CBGs- *** Exercise and diet- *** Lab Results  Component Value Date   HGBA1C 6.7 (H) 12/26/2020   HGBA1C 6.4 11/10/2019   HGBA1C 6.2 03/03/2019    A/P: ***   #hypertension S: medication: Spironolactone 25mg  once a day, Bystolic 10Mg  daily, lisinopril 40mg  daily, chlorthalidone 25Mg  daily - out of chlorthalidone for about a week Home readings #s: *** BP Readings from Last 3 Encounters:  01/17/21 (!) 157/102  01/10/21 (!) 160/103  12/26/20 (!) 154/92  A/P:  ***   #Thoracic aortic aneurysm-this was noted on echocardiogram September 09, 2019-on follow-up CT angiogram 06/08/20 this was not noted-I am going to wait until he had a follow-up cardiology visit before taking this off of his list but I lean toward removed this with last CT angiogram being reassured   # Palpitations- worsened if dehydrated. history of SVT but no recent issues   Health Maintenance Due  Topic Date Due   COLONOSCOPY (Pts 45-62yrs Insurance coverage will need to be confirmed)  Never done   Zoster Vaccines- Shingrix (1 of 2) Never done   OPHTHALMOLOGY EXAM  09/16/2019   COVID-19 Vaccine (4 - Booster for Pfizer series) 12/11/2020   INFLUENZA VACCINE  04/03/2021   Recommended follow up: No follow-ups on file. Future Appointments  Date Time Provider [place]  06/26/2021 11:00 AM Marin Olp, MD [facility]    Lab/Order associations: No diagnosis found.  No orders of the defined types were placed in this encounter.   I,Jada Bradford,acting as a scribe for Garret Reddish, MD.,have documented all relevant documentation on the behalf of Garret Reddish, MD,as directed by  Garret Reddish, MD while in the presence of Garret Reddish, MD.  *** Return precautions advised.  Burnett Corrente

## 2021-06-26 ENCOUNTER — Ambulatory Visit: Payer: No Typology Code available for payment source | Admitting: Family Medicine

## 2021-06-26 DIAGNOSIS — E119 Type 2 diabetes mellitus without complications: Secondary | ICD-10-CM

## 2021-06-26 DIAGNOSIS — E1169 Type 2 diabetes mellitus with other specified complication: Secondary | ICD-10-CM

## 2021-06-26 DIAGNOSIS — E1159 Type 2 diabetes mellitus with other circulatory complications: Secondary | ICD-10-CM

## 2021-06-27 ENCOUNTER — Other Ambulatory Visit (HOSPITAL_COMMUNITY): Payer: Self-pay

## 2021-06-27 MED FILL — Lisinopril Tab 40 MG: ORAL | 90 days supply | Qty: 90 | Fill #2 | Status: AC

## 2021-07-26 ENCOUNTER — Other Ambulatory Visit (HOSPITAL_COMMUNITY): Payer: Self-pay

## 2021-07-26 ENCOUNTER — Other Ambulatory Visit: Payer: Self-pay | Admitting: Family Medicine

## 2021-07-26 MED ORDER — SILDENAFIL CITRATE 20 MG PO TABS
ORAL_TABLET | ORAL | 0 refills | Status: DC
  Filled 2021-07-26: qty 50, 30d supply, fill #0

## 2021-08-11 ENCOUNTER — Other Ambulatory Visit (HOSPITAL_COMMUNITY): Payer: Self-pay

## 2021-08-11 ENCOUNTER — Other Ambulatory Visit: Payer: Self-pay | Admitting: Family Medicine

## 2021-08-11 MED ORDER — NEBIVOLOL HCL 10 MG PO TABS
10.0000 mg | ORAL_TABLET | Freq: Every day | ORAL | 1 refills | Status: DC
  Filled 2021-08-11: qty 90, 90d supply, fill #0
  Filled 2021-11-24: qty 90, 90d supply, fill #1

## 2021-08-18 ENCOUNTER — Other Ambulatory Visit (HOSPITAL_COMMUNITY): Payer: Self-pay

## 2021-08-18 ENCOUNTER — Other Ambulatory Visit: Payer: Self-pay | Admitting: Family Medicine

## 2021-08-18 MED ORDER — AMLODIPINE BESYLATE 10 MG PO TABS
ORAL_TABLET | Freq: Every day | ORAL | 3 refills | Status: DC
  Filled 2021-08-18: qty 90, 90d supply, fill #0
  Filled 2021-11-24: qty 90, 90d supply, fill #1
  Filled 2022-03-02: qty 90, 90d supply, fill #2
  Filled 2022-06-15: qty 90, 90d supply, fill #3

## 2021-08-31 ENCOUNTER — Other Ambulatory Visit (HOSPITAL_COMMUNITY): Payer: Self-pay

## 2021-08-31 MED FILL — Spironolactone Tab 25 MG: ORAL | 30 days supply | Qty: 30 | Fill #3 | Status: AC

## 2021-10-03 ENCOUNTER — Other Ambulatory Visit (HOSPITAL_COMMUNITY): Payer: Self-pay

## 2021-10-03 ENCOUNTER — Other Ambulatory Visit: Payer: Self-pay | Admitting: Family Medicine

## 2021-10-03 MED ORDER — SILDENAFIL CITRATE 20 MG PO TABS
ORAL_TABLET | ORAL | 0 refills | Status: DC
  Filled 2021-10-03: qty 50, 10d supply, fill #0

## 2021-10-18 ENCOUNTER — Other Ambulatory Visit (HOSPITAL_COMMUNITY): Payer: Self-pay

## 2021-10-18 ENCOUNTER — Other Ambulatory Visit: Payer: Self-pay | Admitting: Family Medicine

## 2021-10-18 MED ORDER — LISINOPRIL 40 MG PO TABS
ORAL_TABLET | ORAL | 2 refills | Status: DC
  Filled 2021-10-18: qty 90, 90d supply, fill #0
  Filled 2022-01-26: qty 90, 90d supply, fill #1
  Filled 2022-05-14: qty 90, 90d supply, fill #2

## 2021-10-20 ENCOUNTER — Encounter: Payer: Self-pay | Admitting: Gastroenterology

## 2021-10-24 ENCOUNTER — Other Ambulatory Visit (HOSPITAL_COMMUNITY): Payer: Self-pay

## 2021-11-07 ENCOUNTER — Other Ambulatory Visit (HOSPITAL_COMMUNITY): Payer: Self-pay

## 2021-11-07 ENCOUNTER — Other Ambulatory Visit: Payer: Self-pay | Admitting: Family Medicine

## 2021-11-07 MED ORDER — SPIRONOLACTONE 25 MG PO TABS
ORAL_TABLET | ORAL | 5 refills | Status: DC
  Filled 2021-11-07: qty 30, 30d supply, fill #0
  Filled 2022-01-09: qty 30, 30d supply, fill #1
  Filled 2022-03-23: qty 30, 30d supply, fill #2
  Filled 2022-05-28: qty 30, 30d supply, fill #3
  Filled 2022-06-27: qty 30, 30d supply, fill #4
  Filled 2022-08-07: qty 30, 30d supply, fill #5

## 2021-11-13 ENCOUNTER — Telehealth: Payer: Self-pay | Admitting: *Deleted

## 2021-11-13 NOTE — Telephone Encounter (Signed)
Patient has not called back.No show letter sent to pt's mychart. PV and colon cx. ?

## 2021-11-13 NOTE — Telephone Encounter (Signed)
Patient no show/answer PV appointment for today. I called patient x3, no answer, left a message for the patient to call us back today before 5 pm or the PV and procedure will be cancelled. ? ?

## 2021-11-15 ENCOUNTER — Other Ambulatory Visit (HOSPITAL_COMMUNITY): Payer: Self-pay

## 2021-11-15 ENCOUNTER — Other Ambulatory Visit: Payer: Self-pay | Admitting: Family Medicine

## 2021-11-15 MED ORDER — SILDENAFIL CITRATE 20 MG PO TABS
ORAL_TABLET | ORAL | 0 refills | Status: DC
  Filled 2021-11-15: qty 50, 30d supply, fill #0

## 2021-11-24 ENCOUNTER — Other Ambulatory Visit (HOSPITAL_COMMUNITY): Payer: Self-pay

## 2021-11-27 ENCOUNTER — Encounter: Payer: No Typology Code available for payment source | Admitting: Gastroenterology

## 2022-01-09 ENCOUNTER — Other Ambulatory Visit: Payer: Self-pay | Admitting: Family Medicine

## 2022-01-09 ENCOUNTER — Other Ambulatory Visit (HOSPITAL_COMMUNITY): Payer: Self-pay

## 2022-01-09 MED ORDER — ROSUVASTATIN CALCIUM 20 MG PO TABS
20.0000 mg | ORAL_TABLET | ORAL | 3 refills | Status: DC
  Filled 2022-01-09: qty 36, 84d supply, fill #0
  Filled 2022-04-09: qty 36, 84d supply, fill #1
  Filled 2022-06-27: qty 36, 84d supply, fill #2
  Filled 2022-09-10: qty 36, 84d supply, fill #3

## 2022-01-12 ENCOUNTER — Other Ambulatory Visit: Payer: Self-pay | Admitting: Family Medicine

## 2022-01-15 ENCOUNTER — Other Ambulatory Visit (HOSPITAL_COMMUNITY): Payer: Self-pay

## 2022-01-15 MED ORDER — SILDENAFIL CITRATE 20 MG PO TABS
ORAL_TABLET | ORAL | 0 refills | Status: DC
  Filled 2022-01-15: qty 8, 3d supply, fill #0
  Filled 2022-01-15: qty 42, 27d supply, fill #0

## 2022-01-26 ENCOUNTER — Other Ambulatory Visit (HOSPITAL_COMMUNITY): Payer: Self-pay

## 2022-02-06 ENCOUNTER — Other Ambulatory Visit: Payer: Self-pay | Admitting: Family Medicine

## 2022-02-06 ENCOUNTER — Other Ambulatory Visit (HOSPITAL_COMMUNITY): Payer: Self-pay

## 2022-02-06 MED ORDER — CHLORTHALIDONE 25 MG PO TABS
ORAL_TABLET | Freq: Every day | ORAL | 3 refills | Status: DC
  Filled 2022-02-06: qty 90, 90d supply, fill #0
  Filled 2022-05-21: qty 90, 90d supply, fill #1
  Filled 2022-08-30: qty 90, 90d supply, fill #2
  Filled 2022-12-28: qty 90, 90d supply, fill #3

## 2022-02-06 MED ORDER — SILDENAFIL CITRATE 20 MG PO TABS
ORAL_TABLET | ORAL | 0 refills | Status: DC
  Filled 2022-02-06: qty 50, 10d supply, fill #0

## 2022-03-02 ENCOUNTER — Other Ambulatory Visit (HOSPITAL_COMMUNITY): Payer: Self-pay

## 2022-03-02 ENCOUNTER — Other Ambulatory Visit: Payer: Self-pay | Admitting: Family Medicine

## 2022-03-05 ENCOUNTER — Other Ambulatory Visit (HOSPITAL_COMMUNITY): Payer: Self-pay

## 2022-03-05 MED ORDER — NEBIVOLOL HCL 10 MG PO TABS
10.0000 mg | ORAL_TABLET | Freq: Every day | ORAL | 1 refills | Status: DC
  Filled 2022-03-05: qty 90, 90d supply, fill #0
  Filled 2022-06-13: qty 90, 90d supply, fill #1

## 2022-03-20 ENCOUNTER — Encounter: Payer: Self-pay | Admitting: Gastroenterology

## 2022-03-23 ENCOUNTER — Other Ambulatory Visit (HOSPITAL_COMMUNITY): Payer: Self-pay

## 2022-03-23 ENCOUNTER — Other Ambulatory Visit: Payer: Self-pay | Admitting: Family Medicine

## 2022-03-26 ENCOUNTER — Other Ambulatory Visit (HOSPITAL_COMMUNITY): Payer: Self-pay

## 2022-03-26 MED ORDER — SILDENAFIL CITRATE 20 MG PO TABS
ORAL_TABLET | ORAL | 0 refills | Status: DC
  Filled 2022-03-26: qty 50, 20d supply, fill #0

## 2022-04-05 ENCOUNTER — Other Ambulatory Visit (HOSPITAL_COMMUNITY): Payer: Self-pay

## 2022-04-05 ENCOUNTER — Ambulatory Visit (AMBULATORY_SURGERY_CENTER): Payer: Self-pay | Admitting: *Deleted

## 2022-04-05 VITALS — Ht 79.0 in | Wt 279.0 lb

## 2022-04-05 DIAGNOSIS — Z1211 Encounter for screening for malignant neoplasm of colon: Secondary | ICD-10-CM

## 2022-04-05 MED ORDER — NA SULFATE-K SULFATE-MG SULF 17.5-3.13-1.6 GM/177ML PO SOLN
2.0000 | Freq: Once | ORAL | 0 refills | Status: AC
  Filled 2022-04-05 – 2022-04-13 (×2): qty 354, 1d supply, fill #0

## 2022-04-05 NOTE — Progress Notes (Signed)
No egg or soy allergy known to patient  No issues known to pt with past sedation with any surgeries or procedures Patient denies ever being told they had issues or difficulty with intubation  No FH of Malignant Hyperthermia Pt is not on diet pills Pt is not on  home 02  Pt is not on blood thinners  Pt denies issues with constipation  No A fib or A flutter  PV completed in person. Pt verified name, DOB.  Procedure explained to pt. Prep instructions reviewed, questions answered. Pt encouraged to call with questions or issues.  If pt has My chart, procedure instructions sent via My Chart    

## 2022-04-09 ENCOUNTER — Encounter: Payer: No Typology Code available for payment source | Admitting: Family Medicine

## 2022-04-09 ENCOUNTER — Other Ambulatory Visit (HOSPITAL_COMMUNITY): Payer: Self-pay

## 2022-04-13 ENCOUNTER — Other Ambulatory Visit (HOSPITAL_COMMUNITY): Payer: Self-pay

## 2022-04-25 ENCOUNTER — Encounter: Payer: Self-pay | Admitting: Gastroenterology

## 2022-05-02 ENCOUNTER — Ambulatory Visit (AMBULATORY_SURGERY_CENTER): Payer: No Typology Code available for payment source | Admitting: Gastroenterology

## 2022-05-02 ENCOUNTER — Encounter: Payer: Self-pay | Admitting: Gastroenterology

## 2022-05-02 VITALS — BP 143/100 | HR 61 | Temp 98.6°F | Resp 23 | Ht 79.0 in | Wt 279.0 lb

## 2022-05-02 DIAGNOSIS — Z1211 Encounter for screening for malignant neoplasm of colon: Secondary | ICD-10-CM | POA: Diagnosis present

## 2022-05-02 DIAGNOSIS — D122 Benign neoplasm of ascending colon: Secondary | ICD-10-CM | POA: Diagnosis not present

## 2022-05-02 DIAGNOSIS — D123 Benign neoplasm of transverse colon: Secondary | ICD-10-CM

## 2022-05-02 DIAGNOSIS — D125 Benign neoplasm of sigmoid colon: Secondary | ICD-10-CM

## 2022-05-02 MED ORDER — SODIUM CHLORIDE 0.9 % IV SOLN: 500.0000 mL | Freq: Once | INTRAVENOUS | Status: DC

## 2022-05-02 NOTE — Progress Notes (Signed)
Pt's states no medical or surgical changes since previsit or office visit. 

## 2022-05-02 NOTE — Progress Notes (Signed)
Report to PACU, RN, vss, BBS= Clear.  

## 2022-05-02 NOTE — Patient Instructions (Signed)
Please read handouts provided. Continue present medications. Await pathology results.   YOU HAD AN ENDOSCOPIC PROCEDURE TODAY AT THE [facility]:   Refer to the procedure report that was given to you for any specific questions about what was found during the examination.  If the procedure report does not answer your questions, please call your gastroenterologist to clarify.  If you requested that your care partner not be given the details of your procedure findings, then the procedure report has been included in a sealed envelope for you to review at your convenience later.  YOU SHOULD EXPECT: Some feelings of bloating in the abdomen. Passage of more gas than usual.  Walking can help get rid of the air that was put into your GI tract during the procedure and reduce the bloating. If you had a lower endoscopy (such as a colonoscopy or flexible sigmoidoscopy) you may notice spotting of blood in your stool or on the toilet paper. If you underwent a bowel prep for your procedure, you may not have a normal bowel movement for a few days.  Please Note:  You might notice some irritation and congestion in your nose or some drainage.  This is from the oxygen used during your procedure.  There is no need for concern and it should clear up in a day or so.  SYMPTOMS TO REPORT IMMEDIATELY:  Following lower endoscopy (colonoscopy or flexible sigmoidoscopy):  Excessive amounts of blood in the stool  Significant tenderness or worsening of abdominal pains  Swelling of the abdomen that is new, acute  Fever of 100F or higher  For urgent or emergent issues, a gastroenterologist can be reached at any hour by calling [phone]. Do not use MyChart messaging for urgent concerns.    DIET:  We do recommend a small meal at first, but then you may proceed to your regular diet.  Drink plenty of fluids but you should avoid alcoholic beverages for 24 hours.  ACTIVITY:  You should plan to take it easy for  the rest of today and you should NOT DRIVE or use heavy machinery until tomorrow (because of the sedation medicines used during the test).    FOLLOW UP: Our staff will call the number listed on your records the next business day following your procedure.  We will call around 7:15- 8:00 am to check on you and address any questions or concerns that you may have regarding the information given to you following your procedure. If we do not reach you, we will leave a message.  If you develop any symptoms (ie: fever, flu-like symptoms, shortness of breath, cough etc.) before then, please call [phone].  If you test positive for Covid 19 in the 2 weeks post procedure, please call and report this information to us.    If any biopsies were taken you will be contacted by phone or by letter within the next 1-3 weeks.  Please call us at [phone] if you have not heard about the biopsies in 3 weeks.    SIGNATURES/CONFIDENTIALITY: You and/or your care partner have signed paperwork which will be entered into your electronic medical record.  These signatures attest to the fact that that the information above on your After Visit Summary has been reviewed and is understood.  Full responsibility of the confidentiality of this discharge information lies with you and/or your care-partner.  

## 2022-05-02 NOTE — Progress Notes (Signed)
Called to room to assist during endoscopic procedure.  Patient ID and intended procedure confirmed with present staff. Received instructions for my participation in the procedure from the performing physician.  

## 2022-05-02 NOTE — Progress Notes (Signed)
[facility] History and Physical   Primary Care Physician:  Marin Olp, MD   Reason for Procedure:   Colon cancer screening  Plan:    colonoscopy     HPI: Carlos Patterson is a 51 y.o. male  here for colonoscopy screening - first time exam. Patient denies any bowel symptoms at this time. No family history of colon cancer known. Otherwise feels well without any cardiopulmonary symptoms.   I have discussed risks / benefits of anesthesia and endoscopic procedure with Jonell Cluck and they wish to proceed with the exams as outlined today.    Past Medical History:  Diagnosis Date   Acne vulgaris    Chicken pox    Diabetes mellitus    Hyperlipidemia    Hypertension     Past Surgical History:  Procedure Laterality Date   PILONIDAL CYST EXCISION     removal    Prior to Admission medications   Medication Sig Start Date End Date Taking? Authorizing Provider  amLODipine (NORVASC) 10 MG tablet TAKE 1 TABLET BY MOUTH ONCE A DAY 08/18/21 08/18/22 Yes Marin Olp, MD  aspirin EC 81 MG tablet Take 81 mg by mouth daily.   Yes [provider]  chlorthalidone (HYGROTON) 25 MG tablet TAKE 1 TABLET BY MOUTH ONCE DAILY 02/06/22 02/06/23 Yes Marin Olp, MD  lisinopril (ZESTRIL) 40 MG tablet TAKE 1 TABLET BY MOUTH ONCE DAILY (NEED APPT) 10/18/21 10/18/22 Yes Marin Olp, MD  nebivolol (BYSTOLIC) 10 MG tablet Take 1 tablet (10 mg total) by mouth daily. 03/05/22  Yes Marin Olp, MD  rosuvastatin (CRESTOR) 20 MG tablet Take 1 tablet by mouth 3 times a week. 01/10/22  Yes Marin Olp, MD  sildenafil (REVATIO) 20 MG tablet TAKE 2 TO 5 TABLETS BY MOUTH AS NEEDED ONCE EVERY 48 HOURS FOR ERECTILE DYSFUNCTION 03/26/22 03/26/23 Yes Marin Olp, MD  spironolactone (ALDACTONE) 25 MG tablet TAKE 1/2 - 1 TABLET BY MOUTH ONCE A DAY 11/07/21 11/07/22 Yes Marin Olp, MD  ciclopirox (LOPROX) 0.77 % cream Apply topically 2 (two) times daily as  needed. Patient not taking: Reported on 05/02/2022    [provider]  clindamycin (CLINDAGEL) 1 % gel Apply topically 2 (two) times daily. To boils/pimples in axilla and groin for up to 7 days Patient not taking: Reported on 05/02/2022 02/21/18   Marin Olp, MD  ketoconazole (NIZORAL) 2 % cream Apply 1 application topically daily. For tinea versicolor Patient not taking: Reported on 04/05/2022 01/24/18   Marin Olp, MD    Current Outpatient Medications  Medication Sig Dispense Refill   amLODipine (NORVASC) 10 MG tablet TAKE 1 TABLET BY MOUTH ONCE A DAY 90 tablet 3   aspirin EC 81 MG tablet Take 81 mg by mouth daily.     chlorthalidone (HYGROTON) 25 MG tablet TAKE 1 TABLET BY MOUTH ONCE DAILY 90 tablet 3   lisinopril (ZESTRIL) 40 MG tablet TAKE 1 TABLET BY MOUTH ONCE DAILY (NEED APPT) 90 tablet 2   nebivolol (BYSTOLIC) 10 MG tablet Take 1 tablet (10 mg total) by mouth daily. 90 tablet 1   rosuvastatin (CRESTOR) 20 MG tablet Take 1 tablet by mouth 3 times a week. 36 tablet 3   sildenafil (REVATIO) 20 MG tablet TAKE 2 TO 5 TABLETS BY MOUTH AS NEEDED ONCE EVERY 48 HOURS FOR ERECTILE DYSFUNCTION 50 tablet 0   spironolactone (ALDACTONE) 25 MG tablet TAKE 1/2 - 1 TABLET BY MOUTH ONCE A  DAY 30 tablet 5   ciclopirox (LOPROX) 0.77 % cream Apply topically 2 (two) times daily as needed. (Patient not taking: Reported on 05/02/2022)     clindamycin (CLINDAGEL) 1 % gel Apply topically 2 (two) times daily. To boils/pimples in axilla and groin for up to 7 days (Patient not taking: Reported on 05/02/2022) 60 g 2   ketoconazole (NIZORAL) 2 % cream Apply 1 application topically daily. For tinea versicolor (Patient not taking: Reported on 04/05/2022) 60 g 1   Current Facility-Administered Medications  Medication Dose Route Frequency Provider Last Rate Last Admin   0.9 %  sodium chloride infusion  500 mL Intravenous Once Abigail Teall, Carlota Raspberry, MD        Allergies as of 05/02/2022   (No Known  Allergies)    Family History  Problem Relation Age of Onset   Aneurysm Mother        brain. passed 2003. complained of HA one day and passed next day   Diabetes Mother    Other Father        does not talk much- not a good relationship. unknown   Stomach cancer Maternal Aunt    CVA Maternal Grandmother        x5   Alcohol abuse Maternal Grandmother    Hypertension Maternal Grandfather    Hyperlipidemia Other    Colon polyps Neg Hx    Colon cancer Neg Hx    Esophageal cancer Neg Hx    Rectal cancer Neg Hx     Social History   Socioeconomic History   Marital status: Married    Spouse name: Not on file   Number of children: Not on file   Years of education: Not on file   Highest education level: Not on file  Occupational History   Not on file  Tobacco Use   Smoking status: Every Day    Packs/day: 0.50    Types: Cigarettes   Smokeless tobacco: Never  Vaping Use   Vaping Use: Some days  Substance and Sexual Activity   Alcohol use: Yes    Alcohol/week: 1.0 - 2.0 standard drink of alcohol    Types: 1 - 2 Glasses of wine per week   Drug use: Yes    Types: Marijuana    Comment: sparing   Sexual activity: Not on file  Other Topics Concern   Not on file  Social History Narrative   Married 2000. Step daughter and biological daughter. 23 and 17 in 2018- HS senior [employer].    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    College at [employer] finished.       Hobbies: travel- [place]   Social Determinants of Health   Financial Resource Strain: Not on file  Food Insecurity: Not on file  Transportation Needs: Not on file  Physical Activity: Not on file  Stress: Not on file  Social Connections: Not on file  Intimate Partner Violence: Not on file    Review of Systems: All other review of systems negative except as mentioned in the HPI.  Physical Exam: Vital signs BP (!) 152/91   Pulse 80   Temp 98.6 F (37 C)    Ht '6\' 7"'$  (2.007 m)   Wt 279 lb (126.6 kg)   SpO2 98%   BMI 31.43 kg/m   General:   Alert,  Well-developed, pleasant and cooperative in NAD Lungs:  Clear throughout to auscultation.   Heart:  Regular rate and rhythm Abdomen:  Soft, nontender and nondistended.   Neuro/Psych:  Alert and cooperative. Normal mood and affect. A and O x 3  Jolly Mango, MD [facility]

## 2022-05-02 NOTE — Op Note (Addendum)
[place] Patient Name: Carlos Patterson Procedure Date: 05/02/2022 9:45 AM MRN: [id number] Endoscopist: Remo Lipps P. Havery Moros , MD Age: 51 Referring MD:  Date of Birth: 07/25/1971 Gender: Male Account #: [id number] Procedure:                Colonoscopy Indications:              Screening for colorectal malignant neoplasm, This                            is the patient's first colonoscopy Medicines:                Monitored Anesthesia Care Procedure:                Pre-Anesthesia Assessment:                           - Prior to the procedure, a History and Physical                            was performed, and patient medications and                            allergies were reviewed. The patient's tolerance of                            previous anesthesia was also reviewed. The risks                            and benefits of the procedure and the sedation                            options and risks were discussed with the patient.                            All questions were answered, and informed consent                            was obtained. Prior Anticoagulants: The patient has                            taken no previous anticoagulant or antiplatelet                            agents. ASA Grade Assessment: II - A patient with                            mild systemic disease. After reviewing the risks                            and benefits, the patient was deemed in                            satisfactory condition to undergo the procedure.  After obtaining informed consent, the colonoscope                            was passed under direct vision. Throughout the                            procedure, the patient's blood pressure, pulse, and                            oxygen saturations were monitored continuously. The                            CF [id number] #[id number] was introduced through the anus                            and advanced to the  the cecum, identified by                            appendiceal orifice and ileocecal valve. The                            colonoscopy was performed without difficulty. The                            patient tolerated the procedure well. The quality                            of the bowel preparation was adequate. The                            ileocecal valve, appendiceal orifice, and rectum                            were photographed. Scope In: 9:51:31 AM Scope Out: 10:14:22 AM Scope Withdrawal Time: 0 hours 19 minutes 23 seconds  Total Procedure Duration: 0 hours 22 minutes 51 seconds  Findings:                 The perianal and digital rectal examinations were                            normal.                           Two sessile polyps were found in the ascending                            colon. The polyps were 3 to 6 mm in size. These                            polyps were removed with a cold snare. Resection                            and retrieval were complete.  Two sessile polyps were found in the transverse                            colon. The polyps were 2 to 4 mm in size. These                            polyps were removed with a cold snare. Resection                            and retrieval were complete.                           A 5 mm polyp was found in the sigmoid colon. The                            polyp was sessile. The polyp was removed with a                            cold snare. Resection and retrieval were complete.                           Scattered medium-mouthed diverticula were found in                            the entire colon.                           Internal hemorrhoids were found during retroflexion.                           The exam was otherwise without abnormality. Of                            note, IC valve was normal. I thought a photo was                            taken, but it was not. Complications:             No immediate complications. Estimated blood loss:                            Minimal. Estimated Blood Loss:     Estimated blood loss was minimal. Impression:               - Two 3 to 6 mm polyps in the ascending colon,                            removed with a cold snare. Resected and retrieved.                           - Two 2 to 4 mm polyps in the transverse colon,                            removed  with a cold snare. Resected and retrieved.                           - One 5 mm polyp in the sigmoid colon, removed with                            a cold snare. Resected and retrieved.                           - Diverticulosis in the entire examined colon.                           - Internal hemorrhoids.                           - The examination was otherwise normal. Recommendation:           - Patient has a contact number available for                            emergencies. The signs and symptoms of potential                            delayed complications were discussed with the                            patient. Return to normal activities tomorrow.                            Written discharge instructions were provided to the                            patient.                           - Resume previous diet.                           - Continue present medications.                           - Await pathology results. Remo Lipps P. Havery Moros, MD 05/02/2022 10:18:40 AM This report has been signed electronically.

## 2022-05-03 ENCOUNTER — Telehealth: Payer: Self-pay | Admitting: *Deleted

## 2022-05-03 NOTE — Telephone Encounter (Signed)
  Follow up Call-     05/02/2022    9:08 AM  Call back number  Post procedure Call Back phone  # [phone]  Permission to leave phone message Yes     Patient questions:  Message left to call us if necessary.

## 2022-05-08 ENCOUNTER — Other Ambulatory Visit (HOSPITAL_COMMUNITY): Payer: Self-pay

## 2022-05-08 MED ORDER — CEPHALEXIN 500 MG PO CAPS
500.0000 mg | ORAL_CAPSULE | Freq: Four times a day (QID) | ORAL | 0 refills | Status: DC
  Filled 2022-05-08: qty 28, 7d supply, fill #0

## 2022-05-14 ENCOUNTER — Other Ambulatory Visit (HOSPITAL_COMMUNITY): Payer: Self-pay

## 2022-05-16 ENCOUNTER — Encounter (HOSPITAL_BASED_OUTPATIENT_CLINIC_OR_DEPARTMENT_OTHER): Payer: Self-pay | Admitting: Cardiology

## 2022-05-16 ENCOUNTER — Ambulatory Visit (INDEPENDENT_AMBULATORY_CARE_PROVIDER_SITE_OTHER): Payer: No Typology Code available for payment source | Admitting: Cardiology

## 2022-05-16 VITALS — BP 146/92 | HR 73 | Ht 79.0 in | Wt 276.1 lb

## 2022-05-16 DIAGNOSIS — F17210 Nicotine dependence, cigarettes, uncomplicated: Secondary | ICD-10-CM

## 2022-05-16 DIAGNOSIS — I77810 Thoracic aortic ectasia: Secondary | ICD-10-CM | POA: Diagnosis not present

## 2022-05-16 DIAGNOSIS — I10 Essential (primary) hypertension: Secondary | ICD-10-CM | POA: Diagnosis not present

## 2022-05-16 DIAGNOSIS — I471 Supraventricular tachycardia, unspecified: Secondary | ICD-10-CM

## 2022-05-16 DIAGNOSIS — R002 Palpitations: Secondary | ICD-10-CM | POA: Diagnosis not present

## 2022-05-16 DIAGNOSIS — Z716 Tobacco abuse counseling: Secondary | ICD-10-CM

## 2022-05-16 NOTE — Progress Notes (Signed)
Cardiology Office Note   Date:  05/16/2022   ID:  Carlos Patterson, DOB Aug 28, 1971, MRN [id number]  PCP:  Marin Olp, MD  Cardiologist:  Buford Dresser, MD   Chief Complaint:  Follow up  History of Present Illness:    Carlos Patterson is a 51 y.o. male with hx of hypertension, diabetes, tobacco use and hyperlipidemia here for follow up. Initial visit with me 06/26/19 for palpitations.  Today:  He says he has been feeling really well. He reports that over the summer he has been drinking, smoking, and eating too many salty foods. He expresses his desire to decrease this behavior and become healthier.   He notices some palpitations that typically last for 5-10 minutes, but he endorses always feeling it sometime after he drinks vodka. Once a month he feels some flutters, but not the full palpitation feelings he is used to.   He checks his blood pressure at work and has noticed it elevated. Last week his BP reading was 155/100.    He states he will sometimes experience bendopnea and mild lightheadedness, but has never lost consciousness as a result.   On his days off of work, he still maintains his exercise routine.   He states he will drink more on his off days after exercising and completing tasks like mowing the lawn. However, he is able to limit his drinking. On his days off, he recalls that he has sometimes limited himself to 4 drinks or none, and he has been able to stick to it.   He smokes about 1 ppd. Sometimes on work days he will smoke anywhere from 7-10 cigarettes.      He denies any chest pain, shortness of breath, or peripheral edema. No headaches, syncope, orthopnea, or PND.   Past Medical History:  Diagnosis Date   Acne vulgaris    Chicken pox    Diabetes mellitus    Hyperlipidemia    Hypertension    Past Surgical History:  Procedure Laterality Date   PILONIDAL CYST EXCISION     removal     No outpatient medications have been marked as taking  for the 05/16/22 encounter (Appointment) with Buford Dresser, MD.     Allergies:   Patient has no known allergies.   Social History   Tobacco Use   Smoking status: Every Day    Packs/day: 0.50    Types: Cigarettes   Smokeless tobacco: Never  Vaping Use   Vaping Use: Some days  Substance Use Topics   Alcohol use: Yes    Alcohol/week: 1.0 - 2.0 standard drink of alcohol    Types: 1 - 2 Glasses of wine per week   Drug use: Yes    Types: Marijuana    Comment: sparing     Family Hx: The patient's family history includes Alcohol abuse in his maternal grandmother; Aneurysm in his mother; CVA in his maternal grandmother; Diabetes in his mother; Hyperlipidemia in an other family member; Hypertension in his maternal grandfather; Other in his father; Stomach cancer in his maternal aunt. There is no history of Colon polyps, Colon cancer, Esophageal cancer, or Rectal cancer.  ROS:   Please see the history of present illness.     (+) Mild lightheadedness (+) Palpitations   All other systems reviewed and are negative.   Prior CV studies:   The following studies were reviewed today:  CT Chest Aorta 06/08/2020: FINDINGS: Cardiovascular: The heart is normal in size. No pericardial effusion.  Ascending thoracic:   Diameter at the sinuses of Valsalva 43.5 mm (within normal range).   Diameter at the sinotubular junction 26.5 mm (within normal range).   Maximum diameter of the tubular ascending aorta is 34 mm at the level of the right pulmonary artery (within normal range).   No dissection. The branch vessels are patent. No atherosclerotic calcifications are identified. No obvious coronary artery calcifications.   Mediastinum/Nodes: No mediastinal or hilar mass or adenopathy. Small scattered lymph nodes are noted.   Lungs/Pleura: No acute pulmonary findings. No worrisome pulmonary lesions. No pleural effusions.   Upper Abdomen: No significant upper abdominal findings.    Musculoskeletal: No chest wall mass, supraclavicular or axillary adenopathy. The thyroid gland appears normal.   The bony thorax is intact.   Review of the MIP images confirms the above findings.   IMPRESSION: 1. Normal thoracic aorta. No aneurysm or dissection. No atherosclerotic calcifications. 2. No acute pulmonary findings.   Echo 09/09/19: 1. Left ventricular ejection fraction, by visual estimation, is 60 to  65%. The left ventricle has normal function. There is mildly increased  left ventricular hypertrophy.   2. The left ventricle has no regional wall motion abnormalities.   3. Global right ventricle has normal systolic function.The right  ventricular size is normal. No increase in right ventricular wall  thickness.   4. Left atrial size was normal.   5. Right atrial size was normal.   6. Presence of pericardial fat pad.   7. Trivial pericardial effusion is present.   8. The mitral valve is grossly normal. Trivial mitral valve  regurgitation.   9. The tricuspid valve is grossly normal.  10. The aortic valve is tricuspid. Aortic valve regurgitation is not  visualized. No evidence of aortic valve sclerosis or stenosis.  11. The pulmonic valve was grossly normal. Pulmonic valve regurgitation is  not visualized.  12. Aortic dilatation noted.  13. There is mild dilatation of the aortic root measuring 41 mm.  14. TR signal is inadequate for assessing pulmonary artery systolic  pressure.  15. The inferior vena cava is normal in size with greater than 50%  respiratory variability, suggesting right atrial pressure of 3 mmHg.  16. No prior Echocardiogram.   Monitor 07/23/19 14 days of data recorded on Zio monitor. Patient had a min HR of 51 bpm, max HR of 190 bpm, and avg HR of 79 bpm. Predominant underlying rhythm was Sinus Rhythm. No atrial fibrillation, high degree block, or pauses noted. There were 845 SVT episodes, ranging in rate from 105-193 bpm.  Longest duration was 57  seconds. There is one 4 beat event labeled as NSVT at a rate of 143 bpm, however rate related aberrancy not excluded.  Isolated ventricular ectopy was rare (<1%), and isolated atrial ectopy was occasional (1%). There were 13 triggered events, many near SVT events. Most frequent SVT events occurred between 6pm-midnight, though sporadic events occurred at all times of the day.  Labs/Other Tests and Data Reviewed:    EKG: EKG is personally reviewed.  05/16/22: SR at 73 bpm with 1st degree AV block 03/21/18: Sinus Rhythm. 1st degree AV block.  Recent Labs: No results found for requested labs within last 365 days.   Recent Lipid Panel Lab Results  Component Value Date/Time   CHOL 177 12/26/2020 11:02 AM   TRIG 232.0 (H) 12/26/2020 11:02 AM   HDL 46.90 12/26/2020 11:02 AM   CHOLHDL 4 12/26/2020 11:02 AM   LDLCALC 102 (H) 07/16/2008 09:23 AM  LDLDIRECT 107.0 12/26/2020 11:02 AM    Wt Readings from Last 3 Encounters:  05/02/22 279 lb (126.6 kg)  04/05/22 279 lb (126.6 kg)  01/10/21 280 lb 6.4 oz (127.2 kg)     Objective:    Vital Signs:  BP (!) 146/92 (BP Location: Right Arm, Patient Position: Sitting, Cuff Size: Large)   Pulse 73   Ht '6\' 7"'$  (2.007 m)   Wt 276 lb 1.6 oz (125.2 kg)   BMI 31.10 kg/m    GEN: Well nourished, well developed in no acute distress HEENT: Normal, moist mucous membranes NECK: No JVD CARDIAC: regular rhythm, normal S1 and S2, no rubs or gallops. No murmur. VASCULAR: Radial and DP pulses 2+ bilaterally. No carotid bruits RESPIRATORY:  Clear to auscultation without rales, wheezing or rhonchi  ABDOMEN: Soft, non-tender, non-distended MUSCULOSKELETAL:  Ambulates independently SKIN: Warm and dry, no edema NEUROLOGIC:  Alert and oriented x 3. No focal neuro deficits noted. PSYCHIATRIC:  Normal affect     ASSESSMENT & PLAN:    1. Heart palpitations   2. Paroxysmal SVT (supraventricular tachycardia) ([place])   3. Essential hypertension   4. Ascending aorta  dilatation (HCC)   5. Tobacco abuse counseling       Palpitations: -Monitor shows frequent SVT, but his symptoms have improved -echo, cbc, bmet, tsh within clear triggers -already on beta blocker, had SVT despite this -referred to EP, but as symptoms improved, he declined to schedule with them  Ascending aortic dilation:  -seen on echo, 41 mm. Normal on CT scan 2021 -on aspirin 81 mg and atorvastatin 20 mg daily   Hypertension: above goal today -we discussed lifestyle, see below -he would like to work on lifestyle and stress management -discussed home BP monitoring -continue amlodipine 10 mg, chlorthalidone 25 mg, lisinopril 40 mg, bystolic 10 mg, and spironolactone 25 mg.  -if bp not better at follow up, change lisinopril to ARB   Tobacco use, cessation counseling: The patient was counseled on tobacco cessation today for 4 minutes.  Counseling included reviewing the risks of smoking tobacco products, how it impacts the patient's current medical diagnoses and different strategies for quitting.  Pharmacotherapy to aid in tobacco cessation was not prescribed today.  CV risk counseling and prevention -recommend heart healthy/Mediterranean diet, with whole grains, fruits, vegetable, fish, lean meats, nuts, and olive oil. Limit salt. -recommend moderate walking, 3-5 times/week for 30-50 minutes each session. Aim for at least 150 minutes.week. Goal should be pace of 3 miles/hours, or walking 1.5 miles in 30 minutes -recommend avoidance of tobacco products. Avoid excess alcohol. -ASCVD risk score: The 10-year ASCVD risk score (Arnett DK, et al., 2019) is: 33.3%   Values used to calculate the score:     Age: 5 years     Sex: Male     Is Non-Hispanic African American: Yes     Diabetic: Yes     Tobacco smoker: Yes     Systolic Blood Pressure: 195 mmHg     Is BP treated: Yes     HDL Cholesterol: 48.1 mg/dL     Total Cholesterol: 162 mg/dL    Plan for follow up: 3 months.   Medication  Adjustments/Labs and Tests Ordered: Current medicines are reviewed at length with the patient today.  Concerns regarding medicines are outlined above.   Tests Ordered: Orders Placed This Encounter  Procedures   EKG 12-Lead   No orders of the defined types were placed in this encounter.   Patient Instructions  Medication  Instructions:  Your Physician recommend you continue on your current medication as directed.    *If you need a refill on your cardiac medications before your next appointment, please call your pharmacy*   Lab Work: None ordered today   Testing/Procedures: None ordered today   Follow-Up: At [facility], you and your health needs are our priority.  As part of our continuing mission to provide you with exceptional heart care, we have created designated Provider Care Teams.  These Care Teams include your primary Cardiologist (physician) and Advanced Practice Providers (APPs -  Physician Assistants and Nurse Practitioners) who all work together to provide you with the care you need, when you need it.  We recommend signing up for the patient portal called "MyChart".  Sign up information is provided on this After Visit Summary.  MyChart is used to connect with patients for Virtual Visits (Telemedicine).  Patients are able to view lab/test results, encounter notes, upcoming appointments, etc.  Non-urgent messages can be sent to your provider as well.   To learn more about what you can do with MyChart, go to [email or web address].    Your next appointment:   3 month(s)  The format for your next appointment:   In Person  Provider:   Buford Dresser, MD           I,Breanna Adamick,acting as a scribe for Buford Dresser, MD.,have documented all relevant documentation on the behalf of Buford Dresser, MD,as directed by  Buford Dresser, MD while in the presence of Buford Dresser, MD.   I, Buford Dresser, MD, have  reviewed all documentation for this visit. The documentation on 06/04/22 for the exam, diagnosis, procedures, and orders are all accurate and complete.   Signed, Buford Dresser, MD  05/16/2022  [facility]

## 2022-05-16 NOTE — Patient Instructions (Signed)
Medication Instructions:  Your Physician recommend you continue on your current medication as directed.    *If you need a refill on your cardiac medications before your next appointment, please call your pharmacy*   Lab Work: None ordered today   Testing/Procedures: None ordered today   Follow-Up: At [facility], you and your health needs are our priority.  As part of our continuing mission to provide you with exceptional heart care, we have created designated Provider Care Teams.  These Care Teams include your primary Cardiologist (physician) and Advanced Practice Providers (APPs -  Physician Assistants and Nurse Practitioners) who all work together to provide you with the care you need, when you need it.  We recommend signing up for the patient portal called "MyChart".  Sign up information is provided on this After Visit Summary.  MyChart is used to connect with patients for Virtual Visits (Telemedicine).  Patients are able to view lab/test results, encounter notes, upcoming appointments, etc.  Non-urgent messages can be sent to your provider as well.   To learn more about what you can do with MyChart, go to [email or web address].    Your next appointment:   3 month(s)  The format for your next appointment:   In Person  Provider:   Bridgette Christopher, MD          

## 2022-05-17 ENCOUNTER — Ambulatory Visit (INDEPENDENT_AMBULATORY_CARE_PROVIDER_SITE_OTHER): Payer: No Typology Code available for payment source | Admitting: Family Medicine

## 2022-05-17 ENCOUNTER — Encounter: Payer: Self-pay | Admitting: Family Medicine

## 2022-05-17 VITALS — BP 142/92 | HR 74 | Temp 97.8°F | Ht 79.0 in | Wt 275.2 lb

## 2022-05-17 DIAGNOSIS — E785 Hyperlipidemia, unspecified: Secondary | ICD-10-CM

## 2022-05-17 DIAGNOSIS — Z0100 Encounter for examination of eyes and vision without abnormal findings: Secondary | ICD-10-CM

## 2022-05-17 DIAGNOSIS — I712 Thoracic aortic aneurysm, without rupture, unspecified: Secondary | ICD-10-CM

## 2022-05-17 DIAGNOSIS — E119 Type 2 diabetes mellitus without complications: Secondary | ICD-10-CM

## 2022-05-17 DIAGNOSIS — Z23 Encounter for immunization: Secondary | ICD-10-CM

## 2022-05-17 DIAGNOSIS — Z125 Encounter for screening for malignant neoplasm of prostate: Secondary | ICD-10-CM | POA: Diagnosis not present

## 2022-05-17 DIAGNOSIS — F17200 Nicotine dependence, unspecified, uncomplicated: Secondary | ICD-10-CM | POA: Diagnosis not present

## 2022-05-17 DIAGNOSIS — Z0000 Encounter for general adult medical examination without abnormal findings: Secondary | ICD-10-CM | POA: Diagnosis not present

## 2022-05-17 DIAGNOSIS — E1169 Type 2 diabetes mellitus with other specified complication: Secondary | ICD-10-CM

## 2022-05-17 LAB — LIPID PANEL
Cholesterol: 162 mg/dL (ref 0–200)
HDL: 48.1 mg/dL (ref >39.00)
LDL Cholesterol: 80 mg/dL (ref 0–99)
NonHDL: 113.62
Total CHOL/HDL Ratio: 3
Triglycerides: 167 mg/dL — ABNORMAL HIGH (ref 0.0–149.0)
VLDL: 33.4 mg/dL (ref 0.0–40.0)

## 2022-05-17 LAB — URINALYSIS, ROUTINE W REFLEX MICROSCOPIC
Bilirubin Urine: NEGATIVE
Hgb urine dipstick: NEGATIVE
Ketones, ur: NEGATIVE
Leukocytes,Ua: NEGATIVE
Nitrite: NEGATIVE
RBC / HPF: NONE SEEN (ref 0–?)
Specific Gravity, Urine: 1.025 (ref 1.000–1.030)
Total Protein, Urine: NEGATIVE
Urine Glucose: NEGATIVE
Urobilinogen, UA: 0.2 (ref 0.0–1.0)
pH: 5.5 (ref 5.0–8.0)

## 2022-05-17 LAB — CBC WITH DIFFERENTIAL/PLATELET
Basophils Absolute: 0 10*3/uL (ref 0.0–0.1)
Basophils Relative: 0.6 % (ref 0.0–3.0)
Eosinophils Absolute: 0.2 10*3/uL (ref 0.0–0.7)
Eosinophils Relative: 2.5 % (ref 0.0–5.0)
HCT: 42.2 % (ref 39.0–52.0)
Hemoglobin: 14.6 g/dL (ref 13.0–17.0)
Lymphocytes Relative: 36.5 % (ref 12.0–46.0)
Lymphs Abs: 2.7 10*3/uL (ref 0.7–4.0)
MCHC: 34.6 g/dL (ref 30.0–36.0)
MCV: 90.5 fl (ref 78.0–100.0)
Monocytes Absolute: 0.6 10*3/uL (ref 0.1–1.0)
Monocytes Relative: 8.1 % (ref 3.0–12.0)
Neutro Abs: 3.8 10*3/uL (ref 1.4–7.7)
Neutrophils Relative %: 52.3 % (ref 43.0–77.0)
Platelets: 243 10*3/uL (ref 150.0–400.0)
RBC: 4.67 Mil/uL (ref 4.22–5.81)
RDW: 13.5 % (ref 11.5–15.5)
WBC: 7.3 10*3/uL (ref 4.0–10.5)

## 2022-05-17 LAB — COMPREHENSIVE METABOLIC PANEL
ALT: 28 U/L (ref 0–53)
AST: 22 U/L (ref 0–37)
Albumin: 4.3 g/dL (ref 3.5–5.2)
Alkaline Phosphatase: 89 U/L (ref 39–117)
BUN: 16 mg/dL (ref 6–23)
CO2: 27 mEq/L (ref 19–32)
Calcium: 10.1 mg/dL (ref 8.4–10.5)
Chloride: 101 mEq/L (ref 96–112)
Creatinine, Ser: 1.01 mg/dL (ref 0.40–1.50)
GFR: 85.11 mL/min (ref >60.00)
Glucose, Bld: 164 mg/dL — ABNORMAL HIGH (ref 70–99)
Potassium: 3.6 mEq/L (ref 3.5–5.1)
Sodium: 137 mEq/L (ref 135–145)
Total Bilirubin: 0.9 mg/dL (ref 0.2–1.2)
Total Protein: 7.2 g/dL (ref 6.0–8.3)

## 2022-05-17 LAB — MICROALBUMIN / CREATININE URINE RATIO
Creatinine,U: 109.7 mg/dL
Microalb Creat Ratio: 0.6 mg/g (ref 0.0–30.0)
Microalb, Ur: <0.7 mg/dL (ref 0.0–1.9)

## 2022-05-17 LAB — PSA: PSA: 1.14 ng/mL (ref 0.10–4.00)

## 2022-05-17 LAB — HEMOGLOBIN A1C: Hgb A1c MFr Bld: 6.6 % — ABNORMAL HIGH (ref 4.6–6.5)

## 2022-05-17 NOTE — Patient Instructions (Addendum)
Prevnar 20 today   Strongly encourage you to quit smoking- one of best things you can do for your health  Also max alcohol 2 a day- less even better for blood pressure  mild elevations in blood pressure- he wants to focus on healthy eating and exercise and recheck in 1 month- hold steady on medicines- strongly considered increasing spironolactone- that would be potential next step   We will call you within two weeks about your referral to eye doctor. If you do not hear within 2 weeks, give [place] a call.    Please stop by lab before you go If you have mychart- we will send your results within 3 business days of [place] receiving them.  If you do not have mychart- we will call you about results within 5 business days of [place] receiving them.  *please also note that you will see labs on mychart as soon as they post. I will later go in and write notes on them- will say "notes from Dr. Yong Channel"   Recommended follow up: Return in about 6 months (around 11/15/2022) for followup or sooner if needed.Schedule b4 you leave.

## 2022-05-17 NOTE — Assessment & Plan Note (Signed)
#  Thoracic aortic aneurysm without rupture-there was concern about this on echocardiogram but CT angiogram did not confirm-in fact aorta was normal-no further follow-up needed from 2021 -Will resolve issue

## 2022-05-17 NOTE — Progress Notes (Signed)
Phone: [phone]   Subjective:  Patient presents today for their annual physical. Chief complaint-noted.   See problem oriented charting- ROS- full  review of systems was completed and negative  Per full ROS sheet completed by patient  The following were reviewed and entered/updated in epic: Past Medical History:  Diagnosis Date   Acne vulgaris    Chicken pox    Diabetes mellitus    Hyperlipidemia    Hypertension    Patient Active Problem List   Diagnosis Date Noted   Smoker 10/02/2016    Priority: High   Diabetes mellitus type II, controlled ([place]) 05/14/2008    Priority: High   Resistant hypertension 04/29/2007    Priority: High   Aortic atherosclerosis ([place]) 12/26/2020    Priority: Medium    Palpitations 01/24/2018    Priority: Medium    Hyperlipidemia associated with type 2 diabetes mellitus ([place]) 06/17/2007    Priority: Medium    Erectile dysfunction 07/10/2018    Priority: Low   Hidradenitis suppurativa 02/21/2018    Priority: Low   Tinea versicolor 03/20/2017    Priority: Low   Sebaceous cyst 11/28/2007    Priority: Low   Paroxysmal SVT (supraventricular tachycardia) ([place]) 01/12/2020   Past Surgical History:  Procedure Laterality Date   PILONIDAL CYST EXCISION     removal    Family History  Problem Relation Age of Onset   Aneurysm Mother        brain. passed 2003. complained of HA one day and passed next day   Diabetes Mother    Other Father        does not talk much- not a good relationship. unknown   Stomach cancer Maternal Aunt    CVA Maternal Grandmother        x5   Alcohol abuse Maternal Grandmother    Hypertension Maternal Grandfather    Hyperlipidemia Other    Colon polyps Neg Hx    Colon cancer Neg Hx    Esophageal cancer Neg Hx    Rectal cancer Neg Hx     Medications- reviewed and updated Current Outpatient Medications  Medication Sig Dispense Refill   amLODipine (NORVASC) 10 MG tablet TAKE 1 TABLET BY MOUTH ONCE A DAY 90  tablet 3   aspirin EC 81 MG tablet Take 81 mg by mouth daily.     cephALEXin (KEFLEX) 500 MG capsule Take 1 capsule (500 mg total) by mouth 4 (four) times daily for 7 days 28 capsule 0   chlorthalidone (HYGROTON) 25 MG tablet TAKE 1 TABLET BY MOUTH ONCE DAILY 90 tablet 3   ciclopirox (LOPROX) 0.77 % cream Apply topically 2 (two) times daily as needed.     clindamycin (CLINDAGEL) 1 % gel Apply topically 2 (two) times daily. To boils/pimples in axilla and groin for up to 7 days 60 g 2   ketoconazole (NIZORAL) 2 % cream Apply 1 application topically daily. For tinea versicolor 60 g 1   lisinopril (ZESTRIL) 40 MG tablet TAKE 1 TABLET BY MOUTH ONCE DAILY (NEED APPT) 90 tablet 2   nebivolol (BYSTOLIC) 10 MG tablet Take 1 tablet (10 mg total) by mouth daily. 90 tablet 1   rosuvastatin (CRESTOR) 20 MG tablet Take 1 tablet by mouth 3 times a week. 36 tablet 3   sildenafil (REVATIO) 20 MG tablet TAKE 2 TO 5 TABLETS BY MOUTH AS NEEDED ONCE EVERY 48 HOURS FOR ERECTILE DYSFUNCTION 50 tablet 0   spironolactone (ALDACTONE) 25 MG tablet TAKE 1/2 - 1 TABLET  BY MOUTH ONCE A DAY 30 tablet 5   No current facility-administered medications for this visit.    Allergies-reviewed and updated No Known Allergies  Social History   Social History Narrative   Married 2000. Step daughter and biological daughter. 23 and 17 in 2018- HS senior [employer].    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    College at [employer] finished.       Hobbies: [place]   Objective  Objective:  BP (!) 142/92   Pulse 74   Temp 97.8 F (36.6 C)   Ht '6\' 7"'$  (2.007 m)   Wt 275 lb 3.2 oz (124.8 kg)   SpO2 96%   BMI 31.00 kg/m  Gen: NAD, resting comfortably HEENT: Mucous membranes are moist. Oropharynx normal Neck: no thyromegaly CV: RRR no murmurs rubs or gallops Lungs: CTAB no crackles, wheeze, rhonchi Abdomen: soft/nontender/nondistended/normal bowel sounds. No  rebound or guarding.  Ext: no edema Skin: warm, dry Neuro: grossly normal, moves all extremities, PERRLA    Assessment and Plan  51 y.o. male presenting for annual physical.  Health Maintenance counseling: 1. Anticipatory guidance: Patient counseled regarding regular dental exams - needs to schedule q6 months, eye exams - yearly needs referral,  avoiding smoking and second hand smoke- see below , limiting alcohol to 2 beverages per day - this summer at least 3 a day- can come off for several days without issues-max 2 a day recommended, no illicit drugs - other than sparing marijuana.   2. Risk factor reduction:  Advised patient of need for regular exercise and diet rich and fruits and vegetables to reduce risk of heart attack and stroke.  Exercise- 3 days a week walking plus does some weight training. Basketball was tough Diet/weight management-from last visit weight down 5 lbs.  Wt Readings from Last 3 Encounters:  05/17/22 275 lb 3.2 oz (124.8 kg)  05/16/22 276 lb 1.6 oz (125.2 kg)  05/02/22 279 lb (126.6 kg)  3. Immunizations/screenings/ancillary studies-Prevnar 20 today, flu shot- plans to do with work, COVID-19 vaccination- plans to do new vaccine when released.  Start Shingrix series as well today Immunization History  Administered Date(s) Administered   Influenza,inj,Quad PF,6+ Mos 06/18/2019   Influenza-Unspecified 06/20/2016, 05/07/2018   Moderna Covid-19 Vaccine Bivalent Booster 61yr & up 06/06/2021   Moderna SARS-COV2 Booster Vaccination 08/12/2020   PFIZER(Purple Top)SARS-COV-2 Vaccination 09/12/2019, 09/30/2019   Pneumococcal Polysaccharide-23 04/29/2017   Rabies, IM 01/10/2021, 01/13/2021, 01/17/2021, 01/24/2021   Tdap 03/20/2017   Zoster Recombinat (Shingrix) 05/17/2022  4. Prostate cancer screening- update psa with labs- continue to trend  Lab Results  Component Value Date   PSA 1.25 12/26/2020   PSA 1.44 03/03/2019   PSA 1.31 02/21/2018   5. Colon cancer screening  - adenomatous colon polyps x3-May 04, 2022 with plan for 5-year repeat 6. Skin cancer screening- lower risk due to melanin content. advised regular sunscreen use. Denies worrisome, changing, or new skin lesions.  7. Smoking associated screening (lung cancer screening, AAA screen 65-75, UA)- current smoker- days off about pack a day. 1/2 ppd average over 20 years- so 10 packs years- does not qualify for lung cancer screening 8. STD screening - only active with wife   Status of chronic or acute concerns   # Diabetes S: Medication:Traditionally diet controlled. Doesn't check sugar Lab Results  Component Value Date   HGBA1C 6.7 (H) 12/26/2020   HGBA1C 6.4 11/10/2019  HGBA1C 6.2 03/03/2019  A/P: hopefully stable- update a1c today. Continue current meds for now  #hypertension S: medication: Amlodipine 10 mg, chlorthalidone 25 mg, lisinopril 40 mg, nebivolol 10 mg, spironolactone 25 mg--> half tablet Home readings #s: variable but typically 140/90 BP Readings from Last 3 Encounters:  05/17/22 (!) 142/92  05/16/22 (!) 146/92  05/02/22 (!) 143/100  A/P: mild elevations in blood pressure- he wants to focus on healthy eating and exercise and recheck in 1 month- hold steady on medicines- strongly considered increasing spironolactone- that would be potential next step   #hyperlipidemia #Aortic atherosclerosis (presumed stable) S: Medication:Rosuvastatin 20 mg 3 times a week (history of edema on statins in the past), aspirin 81 mg Lab Results  Component Value Date   CHOL 177 12/26/2020   HDL 46.90 12/26/2020   LDLCALC 102 (H) 07/16/2008   LDLDIRECT 107.0 12/26/2020   TRIG 232.0 (H) 12/26/2020   CHOLHDL 4 12/26/2020  A/P: update lipids- may try 4x a week if still LDL over 100 - prefer LDL under 70 for aortic atherosclerosis but want to be cautious with dose- continue current meds for now   #Thoracic aortic aneurysm without rupture-there was concern about this on echocardiogram but CT  angiogram did not confirm-in fact aorta was normal-no further follow-up needed from 2021  #SVT-just had a visit with Dr. Harrell Gave yesterday-notes pending- said to maintain nebivolol with 3 month follow up . They apparently discussed some potential BP changes  #Cyst- had lanced at urgent care- finishing antibiotics so was inflamed. Wants to hold off on derm visit for full removal  Recommended follow up: 1 month BP check Return in about 6 months (around 11/15/2022) for followup or sooner if needed.Schedule b4 you leave. Future Appointments  Date Time Provider [place]  08/24/2022 10:00 AM Buford Dresser, MD [facility]   Lab/Order associations: fasting   ICD-10-CM   1. Preventative health care  Z00.00     2. Diabetic eye exam ([place])  Z01.00    E11.9     3. Need for shingles vaccine  Z23 Zoster Recombinant (Shingrix )    4. Controlled type 2 diabetes mellitus without complication, without long-term current use of insulin (HCC)  E11.9     5. Thoracic aortic aneurysm without rupture, unspecified part ([place])  I71.20     6. Screening for prostate cancer  Z12.5     7. Current smoker  F17.200      No orders of the defined types were placed in this encounter.  Return precautions advised.  Garret Reddish, MD

## 2022-05-21 ENCOUNTER — Other Ambulatory Visit (HOSPITAL_COMMUNITY): Payer: Self-pay

## 2022-05-28 ENCOUNTER — Other Ambulatory Visit (HOSPITAL_COMMUNITY): Payer: Self-pay

## 2022-05-28 ENCOUNTER — Other Ambulatory Visit: Payer: Self-pay | Admitting: Family Medicine

## 2022-05-29 ENCOUNTER — Other Ambulatory Visit (HOSPITAL_COMMUNITY): Payer: Self-pay

## 2022-05-29 MED ORDER — SILDENAFIL CITRATE 20 MG PO TABS
ORAL_TABLET | ORAL | 0 refills | Status: DC
  Filled 2022-05-29: qty 50, 20d supply, fill #0

## 2022-06-04 ENCOUNTER — Encounter (HOSPITAL_BASED_OUTPATIENT_CLINIC_OR_DEPARTMENT_OTHER): Payer: Self-pay | Admitting: Cardiology

## 2022-06-04 DIAGNOSIS — I77810 Thoracic aortic ectasia: Secondary | ICD-10-CM | POA: Insufficient documentation

## 2022-06-04 DIAGNOSIS — I10 Essential (primary) hypertension: Secondary | ICD-10-CM | POA: Insufficient documentation

## 2022-06-13 ENCOUNTER — Other Ambulatory Visit (HOSPITAL_COMMUNITY): Payer: Self-pay

## 2022-06-15 ENCOUNTER — Other Ambulatory Visit (HOSPITAL_COMMUNITY): Payer: Self-pay

## 2022-06-27 ENCOUNTER — Other Ambulatory Visit (HOSPITAL_COMMUNITY): Payer: Self-pay

## 2022-08-07 ENCOUNTER — Other Ambulatory Visit: Payer: Self-pay | Admitting: Family Medicine

## 2022-08-07 ENCOUNTER — Other Ambulatory Visit (HOSPITAL_COMMUNITY): Payer: Self-pay

## 2022-08-07 MED ORDER — SILDENAFIL CITRATE 20 MG PO TABS
40.0000 mg | ORAL_TABLET | ORAL | 11 refills | Status: DC
  Filled 2022-08-07: qty 75, 30d supply, fill #0
  Filled 2022-11-28: qty 75, 30d supply, fill #1
  Filled 2023-05-22 – 2023-06-22 (×2): qty 75, 30d supply, fill #2

## 2022-08-08 ENCOUNTER — Other Ambulatory Visit (HOSPITAL_COMMUNITY): Payer: Self-pay

## 2022-08-22 ENCOUNTER — Other Ambulatory Visit: Payer: Self-pay | Admitting: Family Medicine

## 2022-08-22 ENCOUNTER — Other Ambulatory Visit: Payer: Self-pay

## 2022-08-22 ENCOUNTER — Other Ambulatory Visit (HOSPITAL_COMMUNITY): Payer: Self-pay

## 2022-08-22 MED ORDER — LISINOPRIL 40 MG PO TABS
40.0000 mg | ORAL_TABLET | Freq: Every day | ORAL | 2 refills | Status: DC
  Filled 2022-08-22: qty 90, 90d supply, fill #0
  Filled 2022-12-02: qty 90, 90d supply, fill #1
  Filled 2023-03-04 – 2023-03-12 (×2): qty 90, 90d supply, fill #2

## 2022-08-24 ENCOUNTER — Ambulatory Visit (HOSPITAL_BASED_OUTPATIENT_CLINIC_OR_DEPARTMENT_OTHER): Payer: No Typology Code available for payment source | Admitting: Cardiology

## 2022-09-10 ENCOUNTER — Other Ambulatory Visit (HOSPITAL_COMMUNITY): Payer: Self-pay

## 2022-09-10 ENCOUNTER — Other Ambulatory Visit: Payer: Self-pay | Admitting: Family Medicine

## 2022-09-10 MED ORDER — SPIRONOLACTONE 25 MG PO TABS
ORAL_TABLET | ORAL | 5 refills | Status: DC
  Filled 2022-09-10: qty 30, 30d supply, fill #0
  Filled 2022-10-18: qty 30, 30d supply, fill #1
  Filled 2022-11-26: qty 30, 30d supply, fill #2
  Filled 2022-12-28: qty 30, 30d supply, fill #3
  Filled 2023-02-01: qty 30, 30d supply, fill #4
  Filled 2023-03-04 – 2023-03-12 (×2): qty 30, 30d supply, fill #5

## 2022-09-26 ENCOUNTER — Other Ambulatory Visit (HOSPITAL_COMMUNITY): Payer: Self-pay

## 2022-09-26 ENCOUNTER — Other Ambulatory Visit: Payer: Self-pay | Admitting: Family Medicine

## 2022-09-26 MED ORDER — NEBIVOLOL HCL 10 MG PO TABS
10.0000 mg | ORAL_TABLET | Freq: Every day | ORAL | 1 refills | Status: DC
  Filled 2022-09-26: qty 90, 90d supply, fill #0
  Filled 2023-01-03: qty 90, 90d supply, fill #1

## 2022-09-26 MED ORDER — AMLODIPINE BESYLATE 10 MG PO TABS
ORAL_TABLET | Freq: Every day | ORAL | 3 refills | Status: DC
  Filled 2022-09-26: qty 90, 90d supply, fill #0
  Filled 2022-12-28: qty 90, 90d supply, fill #1
  Filled 2023-04-07: qty 90, 90d supply, fill #2
  Filled 2023-07-27: qty 90, 90d supply, fill #3

## 2022-09-28 ENCOUNTER — Other Ambulatory Visit (HOSPITAL_COMMUNITY): Payer: Self-pay

## 2022-10-05 ENCOUNTER — Ambulatory Visit (HOSPITAL_BASED_OUTPATIENT_CLINIC_OR_DEPARTMENT_OTHER): Payer: No Typology Code available for payment source | Admitting: Cardiology

## 2022-10-23 ENCOUNTER — Ambulatory Visit (INDEPENDENT_AMBULATORY_CARE_PROVIDER_SITE_OTHER): Payer: 59 | Admitting: Cardiology

## 2022-10-23 ENCOUNTER — Encounter (HOSPITAL_BASED_OUTPATIENT_CLINIC_OR_DEPARTMENT_OTHER): Payer: Self-pay | Admitting: Cardiology

## 2022-10-23 VITALS — BP 116/80 | HR 80 | Ht 79.0 in | Wt 274.4 lb

## 2022-10-23 DIAGNOSIS — Z7189 Other specified counseling: Secondary | ICD-10-CM | POA: Diagnosis not present

## 2022-10-23 DIAGNOSIS — I471 Supraventricular tachycardia, unspecified: Secondary | ICD-10-CM

## 2022-10-23 DIAGNOSIS — I10 Essential (primary) hypertension: Secondary | ICD-10-CM

## 2022-10-23 DIAGNOSIS — Z716 Tobacco abuse counseling: Secondary | ICD-10-CM

## 2022-10-23 DIAGNOSIS — I77810 Thoracic aortic ectasia: Secondary | ICD-10-CM | POA: Diagnosis not present

## 2022-10-23 NOTE — Progress Notes (Signed)
Cardiology Office Note   Date:  10/24/2022   ID:  Carlos Patterson, DOB 19-Apr-1971, MRN [id number]  PCP:  Marin Olp, MD  Cardiologist:  Buford Dresser, MD   Chief Complaint:  Follow up  History of Present Illness:    Carlos Patterson is a 52 y.o. male with hx of hypertension, diabetes, tobacco use and hyperlipidemia here for follow up. Initial visit with me 06/26/19 for palpitations.  At his previous visit he reported that he had been feeling really well. Over the summer he had been drinking, smoking, and eating too many salty foods. He is wanting to decrease this behavior and switch to healthier habits.   At times he was having palpitations that would last for 5-10 minutes, but it was always after drinking vodka. About once a month he would feel flutters, but not palpitations to the extent that he had previously. His blood pressures at work had been elevated, one from the week prior had been 155/100. He was experiencing some occasional bendopnea and mild lightheadedness but denied any loss of consciousness. He was maintaining his exercise routine. He was working on limiting his drinking to 4 drinks or none on his days off and was mostly successful. He was still smoking 1 ppd, less on work days (7-10).   Today, he says he is doing well overall. He reports that he is still struggling to quit smoking. He is down to 5 on days that he works and 10 on the days off. Exercise usually helps him but he is struggling to walk as much due to the cold weather. He has been trying little tricks like smoking half instead of one,but counting it as one, which has lead him to smoking more often even if his cigarette amount is less.  He is tolerating his medication well. He has increase the spirolactone to one whole 25 mg pill. He has felt better since increasing this medication.   He denies any palpitations, chest pain, shortness of breath, or peripheral edema. No lightheadedness, headaches,  syncope, orthopnea, or PND.    Past Medical History:  Diagnosis Date   Acne vulgaris    Chicken pox    Diabetes mellitus    Hyperlipidemia    Hypertension    Past Surgical History:  Procedure Laterality Date   PILONIDAL CYST EXCISION     removal     Current Meds  Medication Sig   amLODipine (NORVASC) 10 MG tablet TAKE 1 TABLET BY MOUTH ONCE A DAY   aspirin EC 81 MG tablet Take 81 mg by mouth daily.   chlorthalidone (HYGROTON) 25 MG tablet TAKE 1 TABLET BY MOUTH ONCE DAILY   ciclopirox (LOPROX) 0.77 % cream Apply topically 2 (two) times daily as needed.   clindamycin (CLINDAGEL) 1 % gel Apply topically 2 (two) times daily. To boils/pimples in axilla and groin for up to 7 days   ketoconazole (NIZORAL) 2 % cream Apply 1 application topically daily. For tinea versicolor   lisinopril (ZESTRIL) 40 MG tablet Take 1 tablet (40 mg total) by mouth daily.   nebivolol (BYSTOLIC) 10 MG tablet Take 1 tablet (10 mg total) by mouth daily.   rosuvastatin (CRESTOR) 20 MG tablet Take 1 tablet by mouth 3 times a week.   sildenafil (REVATIO) 20 MG tablet Take 2-5 tablets (40-100 mg total) by mouth every other day for erectile dysfunction   spironolactone (ALDACTONE) 25 MG tablet TAKE 1/2 - 1 TABLET BY MOUTH ONCE A DAY   [DISCONTINUED]  cephALEXin (KEFLEX) 500 MG capsule Take 1 capsule (500 mg total) by mouth 4 (four) times daily for 7 days     Allergies:   Patient has no known allergies.   Social History   Tobacco Use   Smoking status: Every Day    Packs/day: 0.50    Types: Cigarettes   Smokeless tobacco: Never  Vaping Use   Vaping Use: Some days  Substance Use Topics   Alcohol use: Yes    Alcohol/week: 1.0 - 2.0 standard drink of alcohol    Types: 1 - 2 Glasses of wine per week   Drug use: Yes    Types: Marijuana    Comment: sparing     Family Hx: The patient's family history includes Alcohol abuse in his maternal grandmother; Aneurysm in his mother; CVA in his maternal grandmother;  Diabetes in his mother; Hyperlipidemia in an other family member; Hypertension in his maternal grandfather; Other in his father; Stomach cancer in his maternal aunt. There is no history of Colon polyps, Colon cancer, Esophageal cancer, or Rectal cancer.  ROS:   Please see the history of present illness.     All other systems reviewed and are negative.   Prior CV studies:   The following studies were reviewed today:  CT Chest Aorta 06/08/2020: FINDINGS: Cardiovascular: The heart is normal in size. No pericardial effusion.   Ascending thoracic:   Diameter at the sinuses of Valsalva 43.5 mm (within normal range).   Diameter at the sinotubular junction 26.5 mm (within normal range).   Maximum diameter of the tubular ascending aorta is 34 mm at the level of the right pulmonary artery (within normal range).   No dissection. The branch vessels are patent. No atherosclerotic calcifications are identified. No obvious coronary artery calcifications.   Mediastinum/Nodes: No mediastinal or hilar mass or adenopathy. Small scattered lymph nodes are noted.   Lungs/Pleura: No acute pulmonary findings. No worrisome pulmonary lesions. No pleural effusions.   Upper Abdomen: No significant upper abdominal findings.   Musculoskeletal: No chest wall mass, supraclavicular or axillary adenopathy. The thyroid gland appears normal.   The bony thorax is intact.   Review of the MIP images confirms the above findings.   IMPRESSION: 1. Normal thoracic aorta. No aneurysm or dissection. No atherosclerotic calcifications. 2. No acute pulmonary findings.   Echo 09/09/19: 1. Left ventricular ejection fraction, by visual estimation, is 60 to  65%. The left ventricle has normal function. There is mildly increased  left ventricular hypertrophy.   2. The left ventricle has no regional wall motion abnormalities.   3. Global right ventricle has normal systolic function.The right  ventricular size is  normal. No increase in right ventricular wall  thickness.   4. Left atrial size was normal.   5. Right atrial size was normal.   6. Presence of pericardial fat pad.   7. Trivial pericardial effusion is present.   8. The mitral valve is grossly normal. Trivial mitral valve  regurgitation.   9. The tricuspid valve is grossly normal.  10. The aortic valve is tricuspid. Aortic valve regurgitation is not  visualized. No evidence of aortic valve sclerosis or stenosis.  11. The pulmonic valve was grossly normal. Pulmonic valve regurgitation is  not visualized.  12. Aortic dilatation noted.  13. There is mild dilatation of the aortic root measuring 41 mm.  14. TR signal is inadequate for assessing pulmonary artery systolic  pressure.  15. The inferior vena cava is normal in size with greater  than 50%  respiratory variability, suggesting right atrial pressure of 3 mmHg.  16. No prior Echocardiogram.   Monitor 07/23/19 14 days of data recorded on Zio monitor. Patient had a min HR of 51 bpm, max HR of 190 bpm, and avg HR of 79 bpm. Predominant underlying rhythm was Sinus Rhythm. No atrial fibrillation, high degree block, or pauses noted. There were 845 SVT episodes, ranging in rate from 105-193 bpm.  Longest duration was 57 seconds. There is one 4 beat event labeled as NSVT at a rate of 143 bpm, however rate related aberrancy not excluded.  Isolated ventricular ectopy was rare (<1%), and isolated atrial ectopy was occasional (1%). There were 13 triggered events, many near SVT events. Most frequent SVT events occurred between 6pm-midnight, though sporadic events occurred at all times of the day.  Labs/Other Tests and Data Reviewed:    EKG: EKG is personally reviewed.  10/23/2022: not ordered today 05/16/22: SR at 73 bpm with 1st degree AV block 03/21/18: Sinus Rhythm. 1st degree AV block.  Recent Labs: 05/17/2022: ALT 28; BUN 16; Creatinine, Ser 1.01; Hemoglobin 14.6; Platelets 243.0; Potassium 3.6;  Sodium 137   Recent Lipid Panel Lab Results  Component Value Date/Time   CHOL 162 05/17/2022 09:20 AM   TRIG 167.0 (H) 05/17/2022 09:20 AM   HDL 48.10 05/17/2022 09:20 AM   CHOLHDL 3 05/17/2022 09:20 AM   LDLCALC 80 05/17/2022 09:20 AM   LDLDIRECT 107.0 12/26/2020 11:02 AM    Wt Readings from Last 3 Encounters:  10/23/22 274 lb 6.4 oz (124.5 kg)  05/17/22 275 lb 3.2 oz (124.8 kg)  05/16/22 276 lb 1.6 oz (125.2 kg)     Objective:    Vital Signs:  BP 116/80 (BP Location: Left Arm, Patient Position: Sitting, Cuff Size: Normal)   Pulse 80   Ht 6' 7"$  (2.007 m)   Wt 274 lb 6.4 oz (124.5 kg)   SpO2 96%   BMI 30.91 kg/m    GEN: Well nourished, well developed in no acute distress HEENT: Normal, moist mucous membranes NECK: No JVD CARDIAC: regular rhythm, normal S1 and S2, no rubs or gallops. No murmur. VASCULAR: Radial and DP pulses 2+ bilaterally. No carotid bruits RESPIRATORY:  Clear to auscultation without rales, wheezing or rhonchi  ABDOMEN: Soft, non-tender, non-distended MUSCULOSKELETAL:  Ambulates independently SKIN: Warm and dry, no edema NEUROLOGIC:  Alert and oriented x 3. No focal neuro deficits noted. PSYCHIATRIC:  Normal affect     ASSESSMENT & PLAN:    1. Essential hypertension   2. Paroxysmal SVT (supraventricular tachycardia)   3. Ascending aorta dilatation (HCC)   4. Tobacco abuse counseling   5. Counseling on health promotion and disease prevention       Hypertension: at goal today -we discussed lifestyle, see below -continue amlodipine 10 mg, chlorthalidone 25 mg, lisinopril 40 mg, bystolic 10 mg, and spironolactone 25 mg.  -if bp not better at follow up, change lisinopril to ARB  Palpitations: -Monitor shows frequent SVT, but his symptoms have improved -echo, cbc, bmet, tsh without clear triggers -already on beta blocker, had SVT despite this -referred to EP, but as symptoms improved, he declined to schedule with them  Ascending aortic dilation:   -seen on echo, 41 mm. Normal on CT scan 2021 -on aspirin 81 mg and atorvastatin 20 mg daily   Tobacco use, cessation counseling: The patient was counseled on tobacco cessation today for 5 minutes.  Counseling included reviewing the risks of smoking tobacco products, how it  impacts the patient's current medical diagnoses and different strategies for quitting.  Pharmacotherapy to aid in tobacco cessation was not prescribed today.  CV risk counseling and prevention -recommend heart healthy/Mediterranean diet, with whole grains, fruits, vegetable, fish, lean meats, nuts, and olive oil. Limit salt. -recommend moderate walking, 3-5 times/week for 30-50 minutes each session. Aim for at least 150 minutes.week. Goal should be pace of 3 miles/hours, or walking 1.5 miles in 30 minutes -recommend avoidance of tobacco products. Avoid excess alcohol. -ASCVD risk score: The 10-year ASCVD risk score (Arnett DK, et al., 2019) is: 24%   Values used to calculate the score:     Age: 88 years     Sex: Male     Is Non-Hispanic African American: Yes     Diabetic: Yes     Tobacco smoker: Yes     Systolic Blood Pressure: 99991111 mmHg     Is BP treated: Yes     HDL Cholesterol: 48.1 mg/dL     Total Cholesterol: 162 mg/dL    Plan for follow up: 6 months.    Medication Adjustments/Labs and Tests Ordered: Current medicines are reviewed at length with the patient today.  Concerns regarding medicines are outlined above.   Tests Ordered: No orders of the defined types were placed in this encounter.  No orders of the defined types were placed in this encounter.   Patient Instructions  Medication Instructions:  The current medical regimen is effective;  continue present plan and medications.   *If you need a refill on your cardiac medications before your next appointment, please call your pharmacy*   Lab Work: None  If you have labs (blood work) drawn today and your tests are completely normal, you will  receive your results only by: [place] (if you have MyChart) OR A paper copy in the mail If you have any lab test that is abnormal or we need to change your treatment, we will call you to review the results.   Testing/Procedures: None   Follow-Up: At [facility], you and your health needs are our priority.  As part of our continuing mission to provide you with exceptional heart care, we have created designated Provider Care Teams.  These Care Teams include your primary Cardiologist (physician) and Advanced Practice Providers (APPs -  Physician Assistants and Nurse Practitioners) who all work together to provide you with the care you need, when you need it.  We recommend signing up for the patient portal called "MyChart".  Sign up information is provided on this After Visit Summary.  MyChart is used to connect with patients for Virtual Visits (Telemedicine).  Patients are able to view lab/test results, encounter notes, upcoming appointments, etc.  Non-urgent messages can be sent to your provider as well.   To learn more about what you can do with MyChart, go to [email or web address].    Your next appointment:   6 month(s)  Provider:   Buford Dresser, MD    Other Instructions None     Carlos Patterson,acting as a scribe for Buford Dresser, MD.,have documented all relevant documentation on the behalf of Buford Dresser, MD,as directed by  Buford Dresser, MD while in the presence of Buford Dresser, MD.   I, Buford Dresser, MD, have reviewed all documentation for this visit. The documentation on 10/24/22 for the exam, diagnosis, procedures, and orders are all accurate and complete.   Signed, Buford Dresser, MD  10/24/2022  [facility]

## 2022-10-23 NOTE — Patient Instructions (Signed)
Medication Instructions:  The current medical regimen is effective;  continue present plan and medications.   *If you need a refill on your cardiac medications before your next appointment, please call your pharmacy*   Lab Work: None  If you have labs (blood work) drawn today and your tests are completely normal, you will receive your results only by: [place] (if you have MyChart) OR A paper copy in the mail If you have any lab test that is abnormal or we need to change your treatment, we will call you to review the results.   Testing/Procedures: None   Follow-Up: At [facility], you and your health needs are our priority.  As part of our continuing mission to provide you with exceptional heart care, we have created designated Provider Care Teams.  These Care Teams include your primary Cardiologist (physician) and Advanced Practice Providers (APPs -  Physician Assistants and Nurse Practitioners) who all work together to provide you with the care you need, when you need it.  We recommend signing up for the patient portal called "MyChart".  Sign up information is provided on this After Visit Summary.  MyChart is used to connect with patients for Virtual Visits (Telemedicine).  Patients are able to view lab/test results, encounter notes, upcoming appointments, etc.  Non-urgent messages can be sent to your provider as well.   To learn more about what you can do with MyChart, go to [email or web address].    Your next appointment:   6 month(s)  Provider:   Buford Dresser, MD    Other Instructions None

## 2022-10-24 ENCOUNTER — Encounter (HOSPITAL_BASED_OUTPATIENT_CLINIC_OR_DEPARTMENT_OTHER): Payer: Self-pay | Admitting: Cardiology

## 2022-11-05 ENCOUNTER — Encounter: Payer: Self-pay | Admitting: Pharmacist

## 2022-11-06 ENCOUNTER — Other Ambulatory Visit (HOSPITAL_COMMUNITY): Payer: Self-pay

## 2022-11-06 MED ORDER — AMOXICILLIN 500 MG PO CAPS
500.0000 mg | ORAL_CAPSULE | Freq: Three times a day (TID) | ORAL | 0 refills | Status: DC
  Filled 2022-11-06: qty 21, 7d supply, fill #0

## 2022-11-06 MED ORDER — CHLORHEXIDINE GLUCONATE 0.12 % MT SOLN
OROMUCOSAL | 1 refills | Status: DC
  Filled 2022-11-06: qty 473, 14d supply, fill #0

## 2022-11-16 ENCOUNTER — Ambulatory Visit: Payer: No Typology Code available for payment source | Admitting: Family Medicine

## 2022-11-26 ENCOUNTER — Other Ambulatory Visit (HOSPITAL_COMMUNITY): Payer: Self-pay

## 2022-11-26 ENCOUNTER — Other Ambulatory Visit: Payer: Self-pay | Admitting: Family Medicine

## 2022-11-26 ENCOUNTER — Other Ambulatory Visit: Payer: Self-pay

## 2022-11-26 MED ORDER — ROSUVASTATIN CALCIUM 20 MG PO TABS
20.0000 mg | ORAL_TABLET | ORAL | 3 refills | Status: DC
  Filled 2022-11-26 – 2022-11-28 (×2): qty 36, 84d supply, fill #0
  Filled 2023-02-01: qty 36, 84d supply, fill #1
  Filled 2023-04-29: qty 36, 84d supply, fill #2
  Filled 2023-08-16: qty 36, 84d supply, fill #3

## 2022-11-28 ENCOUNTER — Other Ambulatory Visit: Payer: Self-pay

## 2022-12-05 ENCOUNTER — Encounter: Payer: Self-pay | Admitting: Family Medicine

## 2022-12-05 ENCOUNTER — Ambulatory Visit (INDEPENDENT_AMBULATORY_CARE_PROVIDER_SITE_OTHER): Payer: 59 | Admitting: Family Medicine

## 2022-12-05 VITALS — BP 124/82 | HR 74 | Temp 97.6°F | Ht 79.0 in | Wt 276.6 lb

## 2022-12-05 DIAGNOSIS — Z23 Encounter for immunization: Secondary | ICD-10-CM | POA: Diagnosis not present

## 2022-12-05 DIAGNOSIS — F17210 Nicotine dependence, cigarettes, uncomplicated: Secondary | ICD-10-CM | POA: Diagnosis not present

## 2022-12-05 DIAGNOSIS — E785 Hyperlipidemia, unspecified: Secondary | ICD-10-CM

## 2022-12-05 DIAGNOSIS — F17200 Nicotine dependence, unspecified, uncomplicated: Secondary | ICD-10-CM

## 2022-12-05 DIAGNOSIS — I1A0 Resistant hypertension: Secondary | ICD-10-CM | POA: Diagnosis not present

## 2022-12-05 DIAGNOSIS — E119 Type 2 diabetes mellitus without complications: Secondary | ICD-10-CM

## 2022-12-05 DIAGNOSIS — E1169 Type 2 diabetes mellitus with other specified complication: Secondary | ICD-10-CM | POA: Diagnosis not present

## 2022-12-05 LAB — COMPREHENSIVE METABOLIC PANEL
ALT: 21 U/L (ref 0–53)
AST: 21 U/L (ref 0–37)
Albumin: 4.7 g/dL (ref 3.5–5.2)
Alkaline Phosphatase: 91 U/L (ref 39–117)
BUN: 18 mg/dL (ref 6–23)
CO2: 29 mEq/L (ref 19–32)
Calcium: 10.1 mg/dL (ref 8.4–10.5)
Chloride: 102 mEq/L (ref 96–112)
Creatinine, Ser: 0.95 mg/dL (ref 0.40–1.50)
GFR: 91.24 mL/min (ref >60.00)
Glucose, Bld: 163 mg/dL — ABNORMAL HIGH (ref 70–99)
Potassium: 4 mEq/L (ref 3.5–5.1)
Sodium: 137 mEq/L (ref 135–145)
Total Bilirubin: 0.6 mg/dL (ref 0.2–1.2)
Total Protein: 7.3 g/dL (ref 6.0–8.3)

## 2022-12-05 LAB — MICROALBUMIN / CREATININE URINE RATIO
Creatinine,U: 113.3 mg/dL
Microalb Creat Ratio: 0.9 mg/g (ref 0.0–30.0)
Microalb, Ur: 1 mg/dL (ref 0.0–1.9)

## 2022-12-05 LAB — HEMOGLOBIN A1C: Hgb A1c MFr Bld: 6.9 % — ABNORMAL HIGH (ref 4.6–6.5)

## 2022-12-05 NOTE — Patient Instructions (Addendum)
Please stop by lab before you go If you have mychart- we will send your results within 3 business days of [place] receiving them.  If you do not have mychart- we will call you about results within 5 business days of [place] receiving them.  *please also note that you will see labs on mychart as soon as they post. I will later go in and write notes on them- will say "notes from Dr. Yong Channel"   Please schedule diabetic eye exam and have them send [place] a copy  Recommended follow up: Return in about 7 months (around 07/07/2023) for physical or sooner if needed.Schedule b4 you leave.

## 2022-12-05 NOTE — Progress Notes (Signed)
Phone [phone] In person visit   Subjective:   Carlos Patterson is a 52 y.o. 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    Priority: High   Aortic atherosclerosis 12/26/2020    Priority: Medium    Palpitations 01/24/2018    Priority: Medium    Hyperlipidemia associated with type 2 diabetes mellitus 06/17/2007    Priority: Medium    Erectile dysfunction 07/10/2018    Priority: Low   Hidradenitis suppurativa 02/21/2018    Priority: Low   Tinea versicolor 03/20/2017    Priority: Low   Sebaceous cyst 11/28/2007    Priority: Low   Ascending aorta dilatation 06/04/2022   Essential hypertension 06/04/2022   Paroxysmal SVT (supraventricular tachycardia) 01/12/2020    Medications- reviewed and updated Current Outpatient Medications  Medication Sig Dispense Refill   amLODipine (NORVASC) 10 MG tablet TAKE 1 TABLET BY MOUTH ONCE A DAY 90 tablet 3   aspirin EC 81 MG tablet Take 81 mg by mouth daily.     chlorhexidine (PERIDEX) 0.12 % solution Swish in mouth 1/2 ounce for 30 seconds then spit out--use 3 times a day for 2 weeks 473 mL 1   chlorthalidone (HYGROTON) 25 MG tablet TAKE 1 TABLET BY MOUTH ONCE DAILY 90 tablet 3   ciclopirox (LOPROX) 0.77 % cream Apply topically 2 (two) times daily as needed.     clindamycin (CLINDAGEL) 1 % gel Apply topically 2 (two) times daily. To boils/pimples in axilla and groin for up to 7 days 60 g 2   ketoconazole (NIZORAL) 2 % cream Apply 1 application topically daily. For tinea versicolor 60 g 1   lisinopril (ZESTRIL) 40 MG tablet Take 1 tablet (40 mg total) by mouth daily. 90 tablet 2   nebivolol  (BYSTOLIC) 10 MG tablet Take 1 tablet (10 mg total) by mouth daily. 90 tablet 1   rosuvastatin (CRESTOR) 20 MG tablet Take 1 tablet by mouth 3 times a week. 36 tablet 3   sildenafil (REVATIO) 20 MG tablet Take 2-5 tablets (40-100 mg total) by mouth every other day for erectile dysfunction 75 tablet 11   spironolactone (ALDACTONE) 25 MG tablet TAKE 1/2 - 1 TABLET BY MOUTH ONCE A DAY 30 tablet 5   No current facility-administered medications for this visit.     Objective:  BP 124/82   Pulse 74   Temp 97.6 F (36.4 C)   Ht 6\' 7"  (2.007 m)   Wt 276 lb 9.6 oz (125.5 kg)   SpO2 96%   BMI 31.16 kg/m  Gen: NAD, resting comfortably CV: RRR no murmurs rubs or gallops Lungs: CTAB no crackles, wheeze, rhonchi Ext: no edema Skin: warm, dry     Assessment and Plan   # Smoking-about 10 pack years in 2024- currently cutting down to half cigarette- on work days maybe 4 and nonworkdays about 10 half cigarettes-  -felt off on Wellbutrin -worried about side effects on Chantix -7/10 importance to quit- feels getting older and wants longevity with good quality of life -6/10 readiness.  -barriers- free time and uses as [employer], working out -encouraged to consider what else might  help him feel celebratory  -quit in 20's- pop a peppermint and icewater with a straw  # Diabetes S: Medication:Traditionally diet controlled Exercise and diet- weight up 2 lbs but wants to get back on exercise- as weather improving- back on his 5 mile walks on days off- 4 days a week and some sternght training  Lab Results  Component Value Date   HGBA1C 6.6 (H) 05/17/2022   HGBA1C 6.7 (H) 12/26/2020   HGBA1C 6.4 11/10/2019  A/P: hopefully stable- update a1c today. Continue without meds for now    #hypertension S: medication: Amlodipine 10 mg, chlorthalidone 25 mg, lisinopril 40 mg, nebivolol 10 mg, spironolactone 25 mg BP Readings from Last 3 Encounters:  12/05/22 124/82  10/23/22 116/80   05/17/22 (!) 142/92   A/P: stable- continue current medicines    #hyperlipidemia #Aortic atherosclerosis (presumed stable) S: Medication:Rosuvastatin 20 mg 3 times a week (history of edema on statins in the past), aspirin 81 mg Lab Results  Component Value Date   CHOL 162 05/17/2022   HDL 48.10 05/17/2022   LDLCALC 80 05/17/2022   LDLDIRECT 107.0 12/26/2020   TRIG 167.0 (H) 05/17/2022   CHOLHDL 3 05/17/2022  A/P: close to ideal control- continue current medications and work on lifestyle to further drive LDL down to under 70 ideally  # Palpitations since 2019-follows with Dr. Harrell Gave S:Cardiac monitor showed SVT.  Echo, CBC, BMP, TSH without clear triggers.  SVT occurred despite beta-blocker - Originally referred to electrophysiology but symptoms improved and he canceled visit - today he reports has occasionally felt like he would get palpitations but doesn't come on- hydration tends to help  A/P: no obvious SVT recurrence- continue to monitor- not seeing electrophysiology at this point    #Thoracic aortic aneurysm without rupture-there was concern about this on echocardiogram but CT angiogram did not confirm-in fact aorta was normal-no further follow-up needed from 2021  Recommended follow up: Return in about 7 months (around 07/07/2023) for physical or sooner if needed.Schedule b4 you leave. Future Appointments  Date Time Provider [place]  04/23/2023 10:00 AM Buford Dresser, MD [facility]    Lab/Order associations:   ICD-10-CM   1. Controlled type 2 diabetes mellitus without complication, without long-term current use of insulin  E11.9 Microalbumin / creatinine urine ratio    Hemoglobin A1c    Comprehensive metabolic panel    2. Resistant hypertension  I1A.0     3. Smoker  F17.200     4. Hyperlipidemia associated with type 2 diabetes mellitus Chronic E11.69    E78.5     5. Need for shingles vaccine  Z23 Zoster Recombinant (Shingrix )      No  orders of the defined types were placed in this encounter.   Return precautions advised.  Garret Reddish, MD

## 2023-02-01 ENCOUNTER — Other Ambulatory Visit: Payer: Self-pay

## 2023-02-04 ENCOUNTER — Other Ambulatory Visit (HOSPITAL_COMMUNITY): Payer: Self-pay

## 2023-02-07 ENCOUNTER — Other Ambulatory Visit (HOSPITAL_COMMUNITY): Payer: Self-pay

## 2023-02-07 ENCOUNTER — Other Ambulatory Visit: Payer: Self-pay | Admitting: Family Medicine

## 2023-02-07 MED ORDER — CLINDAMYCIN PHOSPHATE 1 % EX GEL
Freq: Two times a day (BID) | CUTANEOUS | 2 refills | Status: DC
  Filled 2023-02-07: qty 60, 7d supply, fill #0

## 2023-02-08 ENCOUNTER — Ambulatory Visit: Payer: 59 | Admitting: Family Medicine

## 2023-02-15 ENCOUNTER — Other Ambulatory Visit (HOSPITAL_COMMUNITY): Payer: Self-pay

## 2023-03-12 ENCOUNTER — Other Ambulatory Visit (HOSPITAL_COMMUNITY): Payer: Self-pay

## 2023-03-26 ENCOUNTER — Emergency Department (HOSPITAL_BASED_OUTPATIENT_CLINIC_OR_DEPARTMENT_OTHER): Payer: 59 | Admitting: Radiology

## 2023-03-26 ENCOUNTER — Emergency Department (HOSPITAL_BASED_OUTPATIENT_CLINIC_OR_DEPARTMENT_OTHER): Payer: 59

## 2023-03-26 ENCOUNTER — Other Ambulatory Visit: Payer: Self-pay

## 2023-03-26 ENCOUNTER — Emergency Department (HOSPITAL_BASED_OUTPATIENT_CLINIC_OR_DEPARTMENT_OTHER)
Admission: EM | Admit: 2023-03-26 | Discharge: 2023-03-27 | Disposition: A | Discharge status: Home / Self Care | Payer: 59 | Attending: Emergency Medicine | Admitting: Emergency Medicine

## 2023-03-26 ENCOUNTER — Encounter (HOSPITAL_BASED_OUTPATIENT_CLINIC_OR_DEPARTMENT_OTHER): Payer: Self-pay

## 2023-03-26 DIAGNOSIS — R55 Syncope and collapse: Secondary | ICD-10-CM | POA: Insufficient documentation

## 2023-03-26 DIAGNOSIS — E119 Type 2 diabetes mellitus without complications: Secondary | ICD-10-CM | POA: Diagnosis not present

## 2023-03-26 DIAGNOSIS — Z79899 Other long term (current) drug therapy: Secondary | ICD-10-CM | POA: Diagnosis not present

## 2023-03-26 DIAGNOSIS — I10 Essential (primary) hypertension: Secondary | ICD-10-CM | POA: Diagnosis not present

## 2023-03-26 DIAGNOSIS — Z7982 Long term (current) use of aspirin: Secondary | ICD-10-CM | POA: Insufficient documentation

## 2023-03-26 DIAGNOSIS — M545 Low back pain, unspecified: Secondary | ICD-10-CM | POA: Diagnosis not present

## 2023-03-26 DIAGNOSIS — M25562 Pain in left knee: Secondary | ICD-10-CM | POA: Insufficient documentation

## 2023-03-26 LAB — COMPREHENSIVE METABOLIC PANEL
ALT: 37 U/L (ref 0–44)
AST: 29 U/L (ref 15–41)
Albumin: 4.8 g/dL (ref 3.5–5.0)
Alkaline Phosphatase: 84 U/L (ref 38–126)
Anion gap: 13 (ref 5–15)
BUN: 15 mg/dL (ref 6–20)
CO2: 29 mmol/L (ref 22–32)
Calcium: 10 mg/dL (ref 8.9–10.3)
Chloride: 97 mmol/L — ABNORMAL LOW (ref 98–111)
Creatinine, Ser: 0.97 mg/dL (ref 0.61–1.24)
GFR, Estimated: >60 mL/min (ref >60)
Glucose, Bld: 84 mg/dL (ref 70–99)
Potassium: 3.2 mmol/L — ABNORMAL LOW (ref 3.5–5.1)
Sodium: 139 mmol/L (ref 135–145)
Total Bilirubin: 0.9 mg/dL (ref 0.3–1.2)
Total Protein: 8 g/dL (ref 6.5–8.1)

## 2023-03-26 LAB — CBC WITH DIFFERENTIAL/PLATELET
Abs Immature Granulocytes: 0.03 10*3/uL (ref 0.00–0.07)
Basophils Absolute: 0.1 10*3/uL (ref 0.0–0.1)
Basophils Relative: 1 %
Eosinophils Absolute: 0.3 10*3/uL (ref 0.0–0.5)
Eosinophils Relative: 3 %
HCT: 44 % (ref 39.0–52.0)
Hemoglobin: 15.6 g/dL (ref 13.0–17.0)
Immature Granulocytes: 0 %
Lymphocytes Relative: 43 %
Lymphs Abs: 4.5 10*3/uL — ABNORMAL HIGH (ref 0.7–4.0)
MCH: 31 pg (ref 26.0–34.0)
MCHC: 35.5 g/dL (ref 30.0–36.0)
MCV: 87.5 fL (ref 80.0–100.0)
Monocytes Absolute: 0.8 10*3/uL (ref 0.1–1.0)
Monocytes Relative: 8 %
Neutro Abs: 4.8 10*3/uL (ref 1.7–7.7)
Neutrophils Relative %: 45 %
Platelets: 335 10*3/uL (ref 150–400)
RBC: 5.03 MIL/uL (ref 4.22–5.81)
RDW: 13 % (ref 11.5–15.5)
WBC: 10.5 10*3/uL (ref 4.0–10.5)
nRBC: 0 % (ref 0.0–0.2)

## 2023-03-26 LAB — CBG MONITORING, ED: Glucose-Capillary: 78 mg/dL (ref 70–99)

## 2023-03-26 MED ORDER — SODIUM CHLORIDE 0.9 % IV BOLUS
1000.0000 mL | Freq: Once | INTRAVENOUS | Status: AC
  Administered 2023-03-27: 1000 mL via INTRAVENOUS

## 2023-03-26 MED ORDER — POTASSIUM CHLORIDE CRYS ER 20 MEQ PO TBCR
40.0000 meq | EXTENDED_RELEASE_TABLET | Freq: Once | ORAL | Status: AC
  Administered 2023-03-27: 40 meq via ORAL
  Filled 2023-03-26: qty 2

## 2023-03-26 NOTE — ED Provider Notes (Signed)
[facility] AT [facility] Provider Note   CSN: [id number] Arrival date & time: 03/26/23  1954     History  Chief Complaint  Patient presents with   syncopal episode    Carlos Patterson is a 52 y.o. male, history of hypertension, diabetes, hyperlipidemia, who presents to the ED secondary to to have syncopal episodes that occurred around 7 PM today.  He states that he had the day off work, so he drank 4 beers, and 3-4 scotches, and was relaxing throughout the day decided to take a nap, woke up from the nap around 7 PM, and got up and got up felt a little woozy, and then passed out.  Hit the ground does not remember what happened.  States he got up again, and started feeling little bit unsteady again, and then his wife heard a crash and he had fallen again.  She states his eyes were right wide open, and he was falling into the ground.  He states that he has left knee pain, and some buttocks pain, but is able to ambulate.  He denies any chest pain, shortness of breath, recent surgeries or swelling of his legs.  Home Medications Prior to Admission medications   Medication Sig Start Date End Date Taking? Authorizing Provider  amLODipine (NORVASC) 10 MG tablet TAKE 1 TABLET BY MOUTH ONCE A DAY 09/26/22 09/26/23  Shelva Majestic, MD  aspirin EC 81 MG tablet Take 81 mg by mouth daily.    [provider]  chlorhexidine (PERIDEX) 0.12 % solution Swish in mouth 1/2 ounce for 30 seconds then spit out--use 3 times a day for 2 weeks 11/06/22   Evelina Bucy, DMD  chlorthalidone (HYGROTON) 25 MG tablet TAKE 1 TABLET BY MOUTH ONCE DAILY 02/06/22 03/28/23  Shelva Majestic, MD  ciclopirox (LOPROX) 0.77 % cream Apply topically 2 (two) times daily as needed.    [provider]  clindamycin (CLINDAGEL) 1 % gel Apply topically 2 times daily to boils/pimples in groin/axilla for up to 7 days. 02/07/23   Shelva Majestic, MD  ketoconazole (NIZORAL) 2 % cream Apply 1  application topically daily. For tinea versicolor 01/24/18   Shelva Majestic, MD  lisinopril (ZESTRIL) 40 MG tablet Take 1 tablet (40 mg total) by mouth daily. 08/22/22 08/22/23  Shelva Majestic, MD  nebivolol (BYSTOLIC) 10 MG tablet Take 1 tablet (10 mg total) by mouth daily. 09/26/22   Shelva Majestic, MD  rosuvastatin (CRESTOR) 20 MG tablet Take 1 tablet by mouth 3 times a week. 11/26/22   Shelva Majestic, MD  sildenafil (REVATIO) 20 MG tablet Take 2-5 tablets (40-100 mg total) by mouth every other day for erectile dysfunction 08/07/22   Shelva Majestic, MD  spironolactone (ALDACTONE) 25 MG tablet TAKE 1/2 - 1 TABLET BY MOUTH ONCE A DAY 09/10/22 09/10/23  Shelva Majestic, MD      Allergies    Patient has no known allergies.    Review of Systems   Review of Systems  Respiratory:  Negative for shortness of breath.   Cardiovascular:  Negative for chest pain.    Physical Exam Updated Vital Signs BP 124/80   Pulse 78   Temp (!) 97.4 F (36.3 C)   Resp 16   Ht 6\' 7"  (2.007 m)   Wt 122.5 kg   SpO2 96%   BMI 30.42 kg/m  Physical Exam Vitals and nursing note reviewed.  Constitutional:  General: He is not in acute distress.    Appearance: He is well-developed.  HENT:     Head: Normocephalic and atraumatic.  Eyes:     Conjunctiva/sclera: Conjunctivae normal.  Neck:     Comments: Cervical, thoracic, and lumbar tenderness to palpation. Cardiovascular:     Rate and Rhythm: Normal rate and regular rhythm.     Heart sounds: No murmur heard. Pulmonary:     Effort: Pulmonary effort is normal. No respiratory distress.     Breath sounds: Normal breath sounds.  Abdominal:     Palpations: Abdomen is soft.     Tenderness: There is no abdominal tenderness.  Musculoskeletal:        General: No swelling.     Cervical back: Neck supple.     Comments: Left Knee: Tenderness to palpation of patella. An effusion is not present.  Negative anterior and posterior drawer. Negative  Mcmurray's. +Patellar stability. Negative valgus and varus stress test.. Extension and flexion intact. No sensory deficits.   No tenderness to palpation of pubic bone, or bilateral hips.  Skin:    General: Skin is warm and dry.     Capillary Refill: Capillary refill takes less than 2 seconds.  Neurological:     Mental Status: He is alert.     Cranial Nerves: Cranial nerves 2-12 are intact.     Comments: No neurodeficits on exam.  Psychiatric:        Mood and Affect: Mood normal.     ED Results / Procedures / Treatments   Labs (all labs ordered are listed, but only abnormal results are displayed) Labs Reviewed  CBC WITH DIFFERENTIAL/PLATELET - Abnormal; Notable for the following components:      Result Value   Lymphs Abs 4.5 (*)    All other components within normal limits  COMPREHENSIVE METABOLIC PANEL - Abnormal; Notable for the following components:   Potassium 3.2 (*)    Chloride 97 (*)    All other components within normal limits  ETHANOL  CBG MONITORING, ED  TROPONIN I (HIGH SENSITIVITY)    EKG EKG Interpretation Date/Time:  Tuesday March 26 2023 20:10:24 EDT Ventricular Rate:  76 PR Interval:  224 QRS Duration:  82 QT Interval:  396 QTC Calculation: 445 R Axis:   86  Text Interpretation: Sinus rhythm with 1st degree A-V block Otherwise normal ECG When compared with ECG of 11-Jan-2013 00:31, PR interval has increased Confirmed by Edwin Dada ([id number]) on 03/27/2023 12:10:58 AM  Radiology CT Head Wo Contrast  Result Date: 03/26/2023 CLINICAL DATA:  Syncope EXAM: CT HEAD WITHOUT CONTRAST TECHNIQUE: Contiguous axial images were obtained from the base of the skull through the vertex without intravenous contrast. RADIATION DOSE REDUCTION: This exam was performed according to the departmental dose-optimization program which includes automated exposure control, adjustment of the mA and/or kV according to patient size and/or use of iterative reconstruction technique.  COMPARISON:  CT brain 01/11/2013 FINDINGS: Brain: No acute territorial infarction, hemorrhage or intracranial mass. The ventricles are nonenlarged. Vascular: No hyperdense vessels. Mild carotid vascular calcification Skull: Normal. Negative for fracture or focal lesion. Sinuses/Orbits: No acute finding. Mucous retention cysts in the maxillary sinuses. Other: None IMPRESSION: Negative non contrasted CT appearance of the brain. Electronically Signed   By: Jasmine Pang M.D.   On: 03/26/2023 23:51   DG Chest 2 View  Result Date: 03/26/2023 CLINICAL DATA:  Recent syncopal episode EXAM: CHEST - 2 VIEW COMPARISON:  06/08/2020 FINDINGS: The heart size and mediastinal contours are  within normal limits. Both lungs are clear. The visualized skeletal structures are unremarkable. IMPRESSION: No active cardiopulmonary disease. Electronically Signed   By: Alcide Clever M.D.   On: 03/26/2023 23:50   DG Knee Complete 4 Views Left  Result Date: 03/26/2023 CLINICAL DATA:  Syncope knee pain EXAM: LEFT KNEE - COMPLETE 4+ VIEW COMPARISON:  None Available. FINDINGS: No acute fracture or malalignment. Mild to moderate patellofemoral degenerative change. No significant knee effusion. Possible Angeletta Goelz calcified intra-articular loose bodies. IMPRESSION: 1. No acute osseous abnormality. 2. Mild to moderate patellofemoral degenerative change. Possible Junko Ohagan calcified intra-articular loose bodies. Electronically Signed   By: Jasmine Pang M.D.   On: 03/26/2023 23:49    Procedures Procedures    Medications Ordered in ED Medications  sodium chloride 0.9 % bolus 1,000 mL (1,000 mLs Intravenous New Bag/Given 03/27/23 0001)  potassium chloride SA (KLOR-CON M) CR tablet 40 mEq (40 mEq Oral Given 03/27/23 0000)    ED Course/ Medical Decision Making/ A&P                             Medical Decision Making Patient is a well-appearing 52 year old male, here for 2 syncopal episodes today after drinking 4 scotches, and 4 beers.  And  falling asleep and then getting up after a nap.  The first syncopal episode was not witnessed, the second 1 was witnessed.  We will obtain orthostatics, head CT blood work, and x-ray of his knee for further evaluation as well as give him fluids.  He has no neurodeficits on exam, and is able to ambulate with ease.  He is not have any chest pain or shortness of breath and he is not tachycardic, thus I think a PE, is very unlikely.  He has denies any kind of blood in the stool thus I think a GI bleed is unlikely  Amount and/or Complexity of Data Reviewed Labs: ordered.    Details: K of 3.2 Radiology: ordered.    Details: CT head, unremarkable, x-ray of knee, shows some possible loose foreign bodies, that are likely arthritic in nature, chest x-ray clear ECG/medicine tests:     Details: First-degree block Discussion of management or test interpretation with external provider(s): Discussed with Dr. Daun Peacock, pending troponin, likely discharge.  His blood work is reassuring thus far, EKG is fairly unremarkable, head CT is reassuring.  We that his syncopal episode was likely secondary to his intoxication, versus a vasovagal, from dehydration from the alcohol.  I advised him to follow-up with his primary care doctor for the further evaluation.  Dr. Daun Peacock will discharge patient after the troponin likely.  Risk Prescription drug management.    Final Clinical Impression(s) / ED Diagnoses Final diagnoses:  None    Rx / DC Orders ED Discharge Orders     None         Magdalene Tardiff L, PA 03/27/23 0031    Edwin Dada P, DO 04/05/23 1340

## 2023-03-26 NOTE — ED Triage Notes (Signed)
Pt states he has 2 syncopal episodes in 5 min +ETOH liquor and beer 1 was witnessed  Ambulated in triage  NIH 0 No c/o dizziness now

## 2023-03-27 LAB — TROPONIN I (HIGH SENSITIVITY)
Troponin I (High Sensitivity): 2 ng/L (ref <18)
Troponin I (High Sensitivity): 3 ng/L (ref <18)

## 2023-03-27 LAB — ETHANOL: Alcohol, Ethyl (B): <10 mg/dL (ref <10)

## 2023-03-27 NOTE — Discharge Instructions (Signed)
Please follow-up with your primary care doctor, the cause of your syncope is likely secondary to your alcohol use, and that your blood pressure likely dropped from dehydration from the alcohol use.  Return to the ER if you have any weakness, difficulty with speech, chest pain or shortness of breath or swelling of one limb.

## 2023-03-27 NOTE — ED Provider Notes (Signed)
Results provided to patient after asking if it was acceptable to speak with family present.  Patient stated it was acceptable to speak.  Reviewed results.  All questions answered to patient satisfaction.  Encouraged hydration and close follow up with PMD and Dr. Cristal Deer, patient's cardiologist.    Stable for discharge    Kemp Gomes, MD 03/27/23 3557

## 2023-04-07 ENCOUNTER — Other Ambulatory Visit: Payer: Self-pay | Admitting: Family Medicine

## 2023-04-08 ENCOUNTER — Other Ambulatory Visit (HOSPITAL_COMMUNITY): Payer: Self-pay

## 2023-04-08 MED ORDER — CHLORTHALIDONE 25 MG PO TABS
ORAL_TABLET | Freq: Every day | ORAL | 3 refills | Status: DC
  Filled 2023-04-08: qty 90, 90d supply, fill #0
  Filled 2023-07-27: qty 90, 90d supply, fill #1
  Filled 2023-11-21: qty 90, 90d supply, fill #2
  Filled 2024-03-05: qty 90, 90d supply, fill #3

## 2023-04-12 ENCOUNTER — Other Ambulatory Visit (HOSPITAL_COMMUNITY): Payer: Self-pay

## 2023-04-16 ENCOUNTER — Other Ambulatory Visit (HOSPITAL_COMMUNITY): Payer: Self-pay

## 2023-04-16 ENCOUNTER — Other Ambulatory Visit: Payer: Self-pay | Admitting: Family Medicine

## 2023-04-16 MED ORDER — SPIRONOLACTONE 25 MG PO TABS
12.5000 mg | ORAL_TABLET | Freq: Every day | ORAL | 5 refills | Status: DC
  Filled 2023-04-16: qty 30, 30d supply, fill #0
  Filled 2023-05-22 – 2023-05-25 (×2): qty 30, 30d supply, fill #1
  Filled 2023-06-22: qty 30, 30d supply, fill #2
  Filled 2023-07-27: qty 30, 30d supply, fill #3
  Filled 2023-09-10: qty 30, 30d supply, fill #4
  Filled 2023-10-20: qty 30, 30d supply, fill #5

## 2023-04-23 ENCOUNTER — Ambulatory Visit (HOSPITAL_BASED_OUTPATIENT_CLINIC_OR_DEPARTMENT_OTHER): Payer: 59 | Admitting: Cardiology

## 2023-04-29 ENCOUNTER — Other Ambulatory Visit: Payer: Self-pay | Admitting: Family Medicine

## 2023-05-02 ENCOUNTER — Other Ambulatory Visit (HOSPITAL_COMMUNITY): Payer: Self-pay

## 2023-05-08 ENCOUNTER — Other Ambulatory Visit: Payer: Self-pay | Admitting: Family Medicine

## 2023-05-08 ENCOUNTER — Other Ambulatory Visit (HOSPITAL_COMMUNITY): Payer: Self-pay

## 2023-05-08 MED ORDER — NEBIVOLOL HCL 10 MG PO TABS
10.0000 mg | ORAL_TABLET | Freq: Every day | ORAL | 1 refills | Status: DC
  Filled 2023-05-08: qty 90, 90d supply, fill #0
  Filled 2023-08-16: qty 90, 90d supply, fill #1

## 2023-05-08 NOTE — Telephone Encounter (Signed)
Prescription Request  05/08/2023  LOV: 12/05/2022  What is the name of the medication or equipment?  nebivolol (BYSTOLIC) 10 MG tablet  Have you contacted your pharmacy to request a refill? Yes   Which pharmacy would you like this sent to?  [place] [facility] [address] [place] [address] Phone: [phone] Fax: [phone]    Patient notified that their request is being sent to the clinical staff for review and that they should receive a response within 2 business days.   Please advise at Mobile [phone] (mobile)

## 2023-05-09 ENCOUNTER — Other Ambulatory Visit (HOSPITAL_COMMUNITY): Payer: Self-pay

## 2023-05-25 ENCOUNTER — Other Ambulatory Visit (HOSPITAL_COMMUNITY): Payer: Self-pay

## 2023-06-01 ENCOUNTER — Other Ambulatory Visit (HOSPITAL_COMMUNITY): Payer: Self-pay

## 2023-06-10 ENCOUNTER — Encounter: Payer: 59 | Admitting: Family Medicine

## 2023-06-12 ENCOUNTER — Other Ambulatory Visit (HOSPITAL_COMMUNITY): Payer: Self-pay

## 2023-06-22 ENCOUNTER — Other Ambulatory Visit (HOSPITAL_COMMUNITY): Payer: Self-pay

## 2023-06-22 ENCOUNTER — Other Ambulatory Visit: Payer: Self-pay | Admitting: Family Medicine

## 2023-06-24 ENCOUNTER — Other Ambulatory Visit (HOSPITAL_COMMUNITY): Payer: Self-pay

## 2023-06-24 MED ORDER — LISINOPRIL 40 MG PO TABS
40.0000 mg | ORAL_TABLET | Freq: Every day | ORAL | 2 refills | Status: DC
  Filled 2023-06-24: qty 90, 90d supply, fill #0
  Filled 2023-10-08 – 2023-10-24 (×2): qty 90, 90d supply, fill #1
  Filled 2024-02-03: qty 90, 90d supply, fill #2

## 2023-06-27 ENCOUNTER — Other Ambulatory Visit (HOSPITAL_COMMUNITY): Payer: Self-pay

## 2023-06-28 ENCOUNTER — Other Ambulatory Visit (HOSPITAL_COMMUNITY): Payer: Self-pay

## 2023-06-28 ENCOUNTER — Ambulatory Visit (INDEPENDENT_AMBULATORY_CARE_PROVIDER_SITE_OTHER): Payer: 59 | Admitting: Family

## 2023-06-28 ENCOUNTER — Encounter: Payer: Self-pay | Admitting: Family

## 2023-06-28 VITALS — BP 133/95 | HR 72 | Temp 98.0°F | Ht 79.0 in | Wt 262.2 lb

## 2023-06-28 DIAGNOSIS — B360 Pityriasis versicolor: Secondary | ICD-10-CM | POA: Diagnosis not present

## 2023-06-28 DIAGNOSIS — L732 Hidradenitis suppurativa: Secondary | ICD-10-CM | POA: Diagnosis not present

## 2023-06-28 DIAGNOSIS — K047 Periapical abscess without sinus: Secondary | ICD-10-CM | POA: Diagnosis not present

## 2023-06-28 MED ORDER — KETOCONAZOLE 2 % EX CREA
1.0000 | TOPICAL_CREAM | Freq: Every day | CUTANEOUS | 1 refills | Status: AC
Start: 2023-06-28 — End: ?
  Filled 2023-06-28: qty 60, 30d supply, fill #0

## 2023-06-28 MED ORDER — AMOXICILLIN-POT CLAVULANATE 875-125 MG PO TABS
1.0000 | ORAL_TABLET | Freq: Two times a day (BID) | ORAL | 0 refills | Status: DC
Start: 2023-06-28 — End: 2023-11-07
  Filled 2023-06-28: qty 20, 10d supply, fill #0

## 2023-06-28 MED ORDER — CLINDAMYCIN PHOSPHATE 1 % EX GEL
Freq: Two times a day (BID) | CUTANEOUS | 2 refills | Status: DC
Start: 2023-06-28 — End: 2023-12-09
  Filled 2023-06-28: qty 60, 30d supply, fill #0

## 2023-06-28 NOTE — Progress Notes (Signed)
Patient ID: Carlos Patterson, male    DOB: [birth year], 52 y.o.   MRN: [id number]  Chief Complaint  Patient presents with   Dental Pain    Pt c/o dental pain on right side, present for a week and has been worsening. Pt has a dentist appointment on November 5th.    Recurrent Skin Infections    Medication refill of clindamycin gel.   Discussed the use of AI scribe software for clinical note transcription with the patient, who gave verbal consent to proceed.  History of Present Illness   The patient presents with a sore mouth, a recurrent issue that was previously treated approximately six months ago by their dentist. The dentist had injected an antibiotic rinse into the affected area, which was attributed to bone loss allowing bacteria to infiltrate. The patient reports that the current symptoms feel similar to the previous episode. The dentist had indicated that this issue may recur frequently. The patient was also given a course of oral antibiotics during the previous treatment, which resolved the issue until now. The current issue is affecting a different tooth than the previous episode. The patient reports tenderness on both the inside and outside of the gum, with associated swelling of the local lymph node.  The patient also reports a history of a skin rash, fungal rash on his upper chest previously treated with ketoconazole and he also has intermittent boils he gets in his underarms and groin that he applies clindamycin gel.      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 Previous treatment with antibiotic rinse and systemic antibiotics. Currently tender with associated lymphadenopathy. -Prescribe Augmentin 875mg  twice daily for 10 days. -Advise to eat prior to taking medication to minimize stomach upset. -Encourage follow-up with dentist for definitive treatment.  Tinea Versicolor -  History of  recurrent rash, tinea, previously treated with topical antifungal creams. -Sending refill of Ketoconazole cream.  -Advise over-the-counter use of Selsun Blue or Head and Shoulders for potential scalp involvement. -F/U w/PCP prn  Hidradenitis Suppurativa -  Request for refill of Clindamycin gel for acne. -Refill Clindamycin gel prescription.  -F/U w/PCP prn.     Subjective:    Outpatient Medications Prior to Visit  Medication Sig Dispense Refill   amLODipine (NORVASC) 10 MG tablet TAKE 1 TABLET BY MOUTH ONCE A DAY 90 tablet 3   aspirin EC 81 MG tablet Take 81 mg by mouth daily.     chlorhexidine (PERIDEX) 0.12 % solution Swish in mouth 1/2 ounce for 30 seconds then spit out--use 3 times a day for 2 weeks 473 mL 1   chlorthalidone (HYGROTON) 25 MG tablet TAKE 1 TABLET BY MOUTH ONCE DAILY 90 tablet 3   ciclopirox (LOPROX) 0.77 % cream Apply topically 2 (two) times daily as needed.     clindamycin (CLINDAGEL) 1 % gel Apply topically 2 times daily to boils/pimples in groin/axilla for up to 7 days. 60 g 2   ketoconazole (NIZORAL) 2 % cream Apply 1 application topically daily. For tinea versicolor 60 g 1   lisinopril (ZESTRIL) 40 MG tablet Take 1 tablet (40 mg total) by mouth daily. 90 tablet 2   nebivolol (BYSTOLIC) 10 MG tablet Take 1 tablet (10 mg total) by mouth daily. 90 tablet 1   rosuvastatin (CRESTOR) 20 MG tablet Take 1 tablet by mouth 3 times a week. 36 tablet 3   sildenafil (REVATIO)  20 MG tablet Take 2-5 tablets (40-100 mg total) by mouth every other day for erectile dysfunction 75 tablet 11   spironolactone (ALDACTONE) 25 MG tablet Take 0.5-1 tablets (12.5-25 mg total) by mouth daily. 30 tablet 5   No facility-administered medications prior to visit.   Past Medical History:  Diagnosis Date   Acne vulgaris    Chicken pox    Diabetes mellitus    Hyperlipidemia    Hypertension    Past Surgical History:  Procedure Laterality Date   PILONIDAL CYST EXCISION     removal   No  Known Allergies    Objective:    Physical Exam Vitals and nursing note reviewed.  Constitutional:      General: He is not in acute distress.    Appearance: Normal appearance.  HENT:     Head: Normocephalic.     Mouth/Throat:     Mouth: Mucous membranes are moist.     Dentition: Abnormal dentition: lower right side. Dental tenderness, gingival swelling (right lower) and dental abscesses (right lower, rear molar) present.  Cardiovascular:     Rate and Rhythm: Normal rate and regular rhythm.  Pulmonary:     Effort: Pulmonary effort is normal.     Breath sounds: Normal breath sounds.  Musculoskeletal:        General: Normal range of motion.     Cervical back: Normal range of motion.  Skin:    General: Skin is warm and dry.     Findings: Rash (multiple lightened circular patches noted on upper chest area) present.  Neurological:     Mental Status: He is alert and oriented to person, place, and time.  Psychiatric:        Mood and Affect: Mood normal.    BP (!) 133/95 (BP Location: Left Arm, Patient Position: Sitting, Cuff Size: Large)   Pulse 72   Temp 98 F (36.7 C) (Temporal)   Ht 6\' 7"  (2.007 m)   Wt 262 lb 3.2 oz (118.9 kg)   SpO2 100%   BMI 29.54 kg/m  Wt Readings from Last 3 Encounters:  06/28/23 262 lb 3.2 oz (118.9 kg)  03/26/23 270 lb (122.5 kg)  12/05/22 276 lb 9.6 oz (125.5 kg)       Dulce Sellar, NP

## 2023-07-08 ENCOUNTER — Other Ambulatory Visit (HOSPITAL_COMMUNITY): Payer: Self-pay

## 2023-07-10 ENCOUNTER — Other Ambulatory Visit (HOSPITAL_COMMUNITY): Payer: Self-pay

## 2023-07-10 MED ORDER — AMOXICILLIN 875 MG PO TABS
875.0000 mg | ORAL_TABLET | Freq: Two times a day (BID) | ORAL | 0 refills | Status: DC
  Filled 2023-07-10 – 2023-07-29 (×2): qty 20, 10d supply, fill #0

## 2023-07-22 ENCOUNTER — Other Ambulatory Visit (HOSPITAL_COMMUNITY): Payer: Self-pay

## 2023-07-29 ENCOUNTER — Other Ambulatory Visit (HOSPITAL_COMMUNITY): Payer: Self-pay

## 2023-08-05 ENCOUNTER — Other Ambulatory Visit (HOSPITAL_COMMUNITY): Payer: Self-pay

## 2023-08-27 ENCOUNTER — Ambulatory Visit (HOSPITAL_BASED_OUTPATIENT_CLINIC_OR_DEPARTMENT_OTHER): Payer: 59 | Admitting: Cardiology

## 2023-09-12 ENCOUNTER — Other Ambulatory Visit (HOSPITAL_COMMUNITY): Payer: Self-pay

## 2023-10-10 ENCOUNTER — Other Ambulatory Visit (HOSPITAL_COMMUNITY): Payer: Self-pay

## 2023-10-10 ENCOUNTER — Other Ambulatory Visit: Payer: Self-pay

## 2023-10-21 ENCOUNTER — Other Ambulatory Visit (HOSPITAL_COMMUNITY): Payer: Self-pay

## 2023-10-24 ENCOUNTER — Other Ambulatory Visit (HOSPITAL_COMMUNITY): Payer: Self-pay

## 2023-11-07 ENCOUNTER — Ambulatory Visit (INDEPENDENT_AMBULATORY_CARE_PROVIDER_SITE_OTHER): Payer: 59 | Admitting: Cardiology

## 2023-11-07 ENCOUNTER — Encounter (HOSPITAL_BASED_OUTPATIENT_CLINIC_OR_DEPARTMENT_OTHER): Payer: Self-pay | Admitting: Cardiology

## 2023-11-07 VITALS — BP 126/86 | HR 75 | Ht 79.0 in | Wt 279.7 lb

## 2023-11-07 DIAGNOSIS — I471 Supraventricular tachycardia, unspecified: Secondary | ICD-10-CM | POA: Diagnosis not present

## 2023-11-07 DIAGNOSIS — Z716 Tobacco abuse counseling: Secondary | ICD-10-CM | POA: Diagnosis not present

## 2023-11-07 DIAGNOSIS — R002 Palpitations: Secondary | ICD-10-CM

## 2023-11-07 DIAGNOSIS — Z9189 Other specified personal risk factors, not elsewhere classified: Secondary | ICD-10-CM

## 2023-11-07 DIAGNOSIS — I10 Essential (primary) hypertension: Secondary | ICD-10-CM

## 2023-11-07 DIAGNOSIS — Z7189 Other specified counseling: Secondary | ICD-10-CM | POA: Diagnosis not present

## 2023-11-07 DIAGNOSIS — F17200 Nicotine dependence, unspecified, uncomplicated: Secondary | ICD-10-CM | POA: Diagnosis not present

## 2023-11-07 NOTE — Progress Notes (Signed)
 Cardiology Office Note:  .   Date:  11/07/2023  ID:  Carlos Patterson, DOB Oct 11, 1970, MRN [id number] PCP: Shelva Majestic, MD  [facility] Providers Cardiologist:  Jodelle Red, MD {  History of Present Illness: .   Carlos Patterson is a 53 y.o. male with hx of pSVT, hypertension, type II diabetes, tobacco use and hyperlipidemia here for follow up. Initial visit with me 06/26/19 for palpitations.   Pertinent CV history: Monitor 2020 with frequent (845 episodes in 14 days) but brief (less than a minute) SVT. Echo 2021 with EF 60-65%, mild LVH, normal RV, no significant valve disease, mild dilation of ascending aorta (41 mm).  Measurement by CT angio showed ascending aorta was actually 34 mm (normal).  Today: Doing well overall. Has occasional palpitations, most recently managed by drinking water. Better when he avoids alcohol. Palpitations are not severe. He hasn't been doing much activity but planning to increase. He doesn't like cold weather so has been less active.   Blood pressure well controlled. Tolerating medications well, other than increased urination.   Still smoking 1/2 ppd.   Discussed diet and exercise recommendations.  ROS: Denies chest pain, shortness of breath at rest or with normal exertion. No PND, orthopnea, LE edema or unexpected weight gain. No syncope. ROS otherwise negative except as noted.   Studies Reviewed: Marland Kitchen    EKG:       Physical Exam:   VS:  BP 126/86   Pulse 75   Ht 6\' 7"  (2.007 m)   Wt 279 lb 11.2 oz (126.9 kg)   SpO2 94%   BMI 31.51 kg/m    Wt Readings from Last 3 Encounters:  11/07/23 279 lb 11.2 oz (126.9 kg)  06/28/23 262 lb 3.2 oz (118.9 kg)  03/26/23 270 lb (122.5 kg)    GEN: Well nourished, well developed in no acute distress HEENT: Normal, moist mucous membranes NECK: No JVD CARDIAC: regular rhythm, normal S1 and S2, no rubs or gallops. No murmur. VASCULAR: Radial and DP pulses 2+ bilaterally. No carotid  bruits RESPIRATORY:  Clear to auscultation without rales, wheezing or rhonchi  ABDOMEN: Soft, non-tender, non-distended MUSCULOSKELETAL:  Ambulates independently SKIN: Warm and dry, no edema NEUROLOGIC:  Alert and oriented x 3. No focal neuro deficits noted. PSYCHIATRIC:  Normal affect    ASSESSMENT AND PLAN: .     Hypertension: at goal today -we discussed lifestyle, see below -continue amlodipine 10 mg, chlorthalidone 25 mg, lisinopril 40 mg, bystolic 10 mg, and spironolactone 25 mg.    Palpitations: -Monitor shows frequent SVT, but his symptoms have improved -echo, cbc, bmet, tsh without clear triggers -already on beta blocker, had SVT despite this -referred to EP, but as symptoms improved, he declined to schedule with them   Ascending aortic dilation (inaccurate)  -seen on echo, 41 mm. Normal on CT scan 2021  Elevated ASCVD risk -on aspirin 81 mg and rosuvastatin 20 mg three times weekly   Tobacco use, cessation counseling: The patient was counseled on tobacco cessation today for 3 minutes.  Counseling included reviewing the risks of smoking tobacco products, how it impacts the patient's current medical diagnoses and different strategies for quitting.  Pharmacotherapy to aid in tobacco cessation was not prescribed today.  CV risk counseling and prevention -recommend heart healthy/Mediterranean diet, with whole grains, fruits, vegetable, fish, lean meats, nuts, and olive oil. Limit salt. -recommend moderate walking, 3-5 times/week for 30-50 minutes each session. Aim for at least 150 minutes.week. Goal should  be pace of 3 miles/hours, or walking 1.5 miles in 30 minutes -recommend avoidance of tobacco products. Avoid excess alcohol. -ASCVD risk score: The 10-year ASCVD risk score (Arnett DK, et al., 2019) is: 28.6%   Values used to calculate the score:     Age: 86 years     Sex: Male     Is Non-Hispanic African American: Yes     Diabetic: Yes     Tobacco smoker: Yes      Systolic Blood Pressure: 126 mmHg     Is BP treated: Yes     HDL Cholesterol: 48.1 mg/dL     Total Cholesterol: 162 mg/dL    Dispo: 2 years or sooner as needed  Signed, Jodelle Red, MD   Jodelle Red, MD, PhD, [facility]  [facility]  [place]  [facility] at [facility] [address] [place], [place] [address] [phone]

## 2023-11-07 NOTE — Patient Instructions (Signed)
Medication Instructions:  Your physician recommends that you continue on your current medications as directed. Please refer to the Current Medication list given to you today.  *If you need a refill on your cardiac medications before your next appointment, please call your pharmacy*  Follow-Up: At [facility], you and your health needs are our priority.  As part of our continuing mission to provide you with exceptional heart care, we have created designated Provider Care Teams.  These Care Teams include your primary Cardiologist (physician) and Advanced Practice Providers (APPs -  Physician Assistants and Nurse Practitioners) who all work together to provide you with the care you need, when you need it.  We recommend signing up for the patient portal called "MyChart".  Sign up information is provided on this After Visit Summary.  MyChart is used to connect with patients for Virtual Visits (Telemedicine).  Patients are able to view lab/test results, encounter notes, upcoming appointments, etc.  Non-urgent messages can be sent to your provider as well.   To learn more about what you can do with MyChart, go to [email or web address].    Your next appointment:   2 year(s)  Provider:   Jodelle Red, MD

## 2023-11-21 ENCOUNTER — Other Ambulatory Visit: Payer: Self-pay | Admitting: Family Medicine

## 2023-11-21 ENCOUNTER — Other Ambulatory Visit (HOSPITAL_COMMUNITY): Payer: Self-pay

## 2023-11-21 MED ORDER — AMLODIPINE BESYLATE 10 MG PO TABS
10.0000 mg | ORAL_TABLET | Freq: Every day | ORAL | 3 refills | Status: AC
  Filled 2023-11-21: qty 90, 90d supply, fill #0
  Filled 2024-03-05: qty 90, 90d supply, fill #1
  Filled 2024-06-25: qty 90, 90d supply, fill #2
  Filled 2024-10-06: qty 90, 90d supply, fill #3

## 2023-11-26 ENCOUNTER — Other Ambulatory Visit: Payer: Self-pay

## 2023-11-26 ENCOUNTER — Other Ambulatory Visit: Payer: Self-pay | Admitting: Family Medicine

## 2023-11-26 ENCOUNTER — Other Ambulatory Visit (HOSPITAL_COMMUNITY): Payer: Self-pay

## 2023-11-26 MED ORDER — ROSUVASTATIN CALCIUM 20 MG PO TABS
20.0000 mg | ORAL_TABLET | ORAL | 3 refills | Status: DC
  Filled 2023-11-26 – 2023-12-09 (×2): qty 36, 84d supply, fill #0

## 2023-11-26 MED ORDER — SPIRONOLACTONE 25 MG PO TABS
12.5000 mg | ORAL_TABLET | Freq: Every day | ORAL | 0 refills | Status: DC
  Filled 2023-11-26 – 2023-12-09 (×2): qty 30, 30d supply, fill #0

## 2023-12-06 ENCOUNTER — Other Ambulatory Visit (HOSPITAL_COMMUNITY): Payer: Self-pay

## 2023-12-09 ENCOUNTER — Other Ambulatory Visit (HOSPITAL_COMMUNITY): Payer: Self-pay

## 2023-12-09 ENCOUNTER — Ambulatory Visit: Payer: 59 | Admitting: Family Medicine

## 2023-12-09 ENCOUNTER — Encounter: Payer: Self-pay | Admitting: Family Medicine

## 2023-12-09 VITALS — BP 120/70 | HR 78 | Ht 79.0 in | Wt 281.6 lb

## 2023-12-09 DIAGNOSIS — E119 Type 2 diabetes mellitus without complications: Secondary | ICD-10-CM

## 2023-12-09 DIAGNOSIS — E1169 Type 2 diabetes mellitus with other specified complication: Secondary | ICD-10-CM

## 2023-12-09 DIAGNOSIS — I1A0 Resistant hypertension: Secondary | ICD-10-CM | POA: Diagnosis not present

## 2023-12-09 DIAGNOSIS — L732 Hidradenitis suppurativa: Secondary | ICD-10-CM | POA: Diagnosis not present

## 2023-12-09 DIAGNOSIS — F17200 Nicotine dependence, unspecified, uncomplicated: Secondary | ICD-10-CM

## 2023-12-09 DIAGNOSIS — Z0000 Encounter for general adult medical examination without abnormal findings: Secondary | ICD-10-CM

## 2023-12-09 DIAGNOSIS — Z125 Encounter for screening for malignant neoplasm of prostate: Secondary | ICD-10-CM

## 2023-12-09 DIAGNOSIS — Z1283 Encounter for screening for malignant neoplasm of skin: Secondary | ICD-10-CM | POA: Diagnosis not present

## 2023-12-09 DIAGNOSIS — E785 Hyperlipidemia, unspecified: Secondary | ICD-10-CM | POA: Diagnosis not present

## 2023-12-09 LAB — MICROALBUMIN / CREATININE URINE RATIO
Creatinine,U: 168.2 mg/dL
Microalb Creat Ratio: UNDETERMINED mg/g (ref 0.0–30.0)
Microalb, Ur: <0.7 mg/dL

## 2023-12-09 LAB — CBC WITH DIFFERENTIAL/PLATELET
Basophils Absolute: 0 10*3/uL (ref 0.0–0.1)
Basophils Relative: 0.4 % (ref 0.0–3.0)
Eosinophils Absolute: 0.2 10*3/uL (ref 0.0–0.7)
Eosinophils Relative: 1.6 % (ref 0.0–5.0)
HCT: 42.7 % (ref 39.0–52.0)
Hemoglobin: 14.7 g/dL (ref 13.0–17.0)
Lymphocytes Relative: 28.3 % (ref 12.0–46.0)
Lymphs Abs: 3.4 10*3/uL (ref 0.7–4.0)
MCHC: 34.5 g/dL (ref 30.0–36.0)
MCV: 90.7 fl (ref 78.0–100.0)
Monocytes Absolute: 0.9 10*3/uL (ref 0.1–1.0)
Monocytes Relative: 7.7 % (ref 3.0–12.0)
Neutro Abs: 7.4 10*3/uL (ref 1.4–7.7)
Neutrophils Relative %: 62 % (ref 43.0–77.0)
Platelets: 294 10*3/uL (ref 150.0–400.0)
RBC: 4.71 Mil/uL (ref 4.22–5.81)
RDW: 13.7 % (ref 11.5–15.5)
WBC: 11.9 10*3/uL — ABNORMAL HIGH (ref 4.0–10.5)

## 2023-12-09 LAB — LIPID PANEL
Cholesterol: 207 mg/dL — ABNORMAL HIGH (ref 0–200)
HDL: 43.1 mg/dL (ref >39.00)
LDL Cholesterol: 89 mg/dL (ref 0–99)
NonHDL: 164.14
Total CHOL/HDL Ratio: 5
Triglycerides: 375 mg/dL — ABNORMAL HIGH (ref 0.0–149.0)
VLDL: 75 mg/dL — ABNORMAL HIGH (ref 0.0–40.0)

## 2023-12-09 LAB — COMPREHENSIVE METABOLIC PANEL WITH GFR
ALT: 23 U/L (ref 0–53)
AST: 19 U/L (ref 0–37)
Albumin: 4.7 g/dL (ref 3.5–5.2)
Alkaline Phosphatase: 99 U/L (ref 39–117)
BUN: 21 mg/dL (ref 6–23)
CO2: 26 meq/L (ref 19–32)
Calcium: 9.9 mg/dL (ref 8.4–10.5)
Chloride: 101 meq/L (ref 96–112)
Creatinine, Ser: 1.29 mg/dL (ref 0.40–1.50)
GFR: 62.76 mL/min (ref >60.00)
Glucose, Bld: 124 mg/dL — ABNORMAL HIGH (ref 70–99)
Potassium: 4 meq/L (ref 3.5–5.1)
Sodium: 138 meq/L (ref 135–145)
Total Bilirubin: 0.6 mg/dL (ref 0.2–1.2)
Total Protein: 7.5 g/dL (ref 6.0–8.3)

## 2023-12-09 LAB — URINALYSIS, ROUTINE W REFLEX MICROSCOPIC
Bilirubin Urine: NEGATIVE
Hgb urine dipstick: NEGATIVE
Ketones, ur: NEGATIVE
Leukocytes,Ua: NEGATIVE
Nitrite: NEGATIVE
Specific Gravity, Urine: 1.025 (ref 1.000–1.030)
Total Protein, Urine: NEGATIVE
Urine Glucose: NEGATIVE
Urobilinogen, UA: 0.2 (ref 0.0–1.0)
WBC, UA: NONE SEEN (ref 0–?)
pH: 6 (ref 5.0–8.0)

## 2023-12-09 LAB — PSA: PSA: 1.39 ng/mL (ref 0.10–4.00)

## 2023-12-09 LAB — HEMOGLOBIN A1C: Hgb A1c MFr Bld: 7.4 % — ABNORMAL HIGH (ref 4.6–6.5)

## 2023-12-09 MED ORDER — CLINDAMYCIN PHOSPHATE 1 % EX GEL
Freq: Two times a day (BID) | CUTANEOUS | 3 refills | Status: DC
Start: 2023-12-09 — End: 2023-12-13
  Filled 2023-12-09: qty 60, 30d supply, fill #0

## 2023-12-09 NOTE — Progress Notes (Addendum)
 Phone: [phone]   Subjective:  Patient presents today for their annual physical. Chief complaint-noted.   See problem oriented charting- ROS- full  review of systems was completed and negative  except for: occasional palpitations  The following were reviewed and entered/updated in epic: Past Medical History:  Diagnosis Date   Acne vulgaris    Chicken pox    Diabetes mellitus    Hyperlipidemia    Hypertension    Patient Active Problem List   Diagnosis Date Noted   Smoker 10/02/2016    Priority: High   Diabetes mellitus type II, controlled (HCC) 05/14/2008    Priority: High   Resistant hypertension 04/29/2007    Priority: High   Aortic atherosclerosis (HCC) 12/26/2020    Priority: Medium    Palpitations 01/24/2018    Priority: Medium    Hyperlipidemia associated with type 2 diabetes mellitus (HCC) 06/17/2007    Priority: Medium    Erectile dysfunction 07/10/2018    Priority: Low   Hidradenitis suppurativa 02/21/2018    Priority: Low   Tinea versicolor 03/20/2017    Priority: Low   Sebaceous cyst 11/28/2007    Priority: Low   Essential hypertension 06/04/2022   Paroxysmal SVT (supraventricular tachycardia) (HCC) 01/12/2020   Past Surgical History:  Procedure Laterality Date   PILONIDAL CYST EXCISION     removal    Family History  Problem Relation Age of Onset   Aneurysm Mother        brain. passed 2003. complained of HA one day and passed next day   Diabetes Mother    Other Father        does not talk much- not a good relationship. unknown   Stomach cancer Maternal Aunt    CVA Maternal Grandmother        x5   Alcohol abuse Maternal Grandmother    Hypertension Maternal Grandfather    Hyperlipidemia Other    Colon polyps Neg Hx    Colon cancer Neg Hx    Esophageal cancer Neg Hx    Rectal cancer Neg Hx     Medications- reviewed and updated Current Outpatient Medications  Medication Sig Dispense Refill   amLODipine (NORVASC) 10 MG tablet Take 1  tablet (10 mg total) by mouth daily. 90 tablet 3   aspirin EC 81 MG tablet Take 81 mg by mouth daily.     chlorthalidone (HYGROTON) 25 MG tablet TAKE 1 TABLET BY MOUTH ONCE DAILY 90 tablet 3   ciclopirox (LOPROX) 0.77 % cream Apply topically 2 (two) times daily as needed.     ketoconazole (NIZORAL) 2 % cream Apply 1 Application topically daily. For tinea versicolor - fungal rash 60 g 1   lisinopril (ZESTRIL) 40 MG tablet Take 1 tablet (40 mg total) by mouth daily. 90 tablet 2   nebivolol (BYSTOLIC) 10 MG tablet Take 1 tablet (10 mg total) by mouth daily. 90 tablet 1   rosuvastatin (CRESTOR) 20 MG tablet Take 1 tablet by mouth 3 times a week. 36 tablet 3   sildenafil (REVATIO) 20 MG tablet Take 2-5 tablets (40-100 mg total) by mouth every other day for erectile dysfunction 75 tablet 11   spironolactone (ALDACTONE) 25 MG tablet Take 0.5-1 tablets (12.5-25 mg total) by mouth daily. 30 tablet 0   clindamycin (CLINDAGEL) 1 % gel Apply topically 2 times daily to boils/pimples in groin/axilla for up to 7 days. 60 g 3   No current facility-administered medications for this visit.    Allergies-reviewed and updated No Known  Allergies  Social History   Social History Narrative   Married 2000. Step daughter and biological daughter. 23 and 17 in 2018- HS senior [employer].    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    College at [employer] finished.       Hobbies: travel- [place]   Objective  Objective:  BP 120/70   Pulse 78   Ht 6\' 7"  (2.007 m)   Wt 281 lb 9.6 oz (127.7 kg)   SpO2 97%   BMI 31.72 kg/m  Gen: NAD, resting comfortably HEENT: Mucous membranes are moist. Oropharynx normal Neck: no thyromegaly CV: RRR no murmurs rubs or gallops Lungs: CTAB no crackles, wheeze, rhonchi Abdomen: soft/nontender/nondistended/normal bowel sounds. No rebound or guarding.  Ext: no edema Skin: warm, dry Neuro: grossly normal, moves all  extremities, PERRLA Below scrotum 3-4 inflammatory pustules without tunneling Diabetic foot exam was performed with the following findings:   No deformities, ulcerations, or other skin breakdown Normal sensation of 10g monofilament Intact posterior tibialis and dorsalis pedis pulses      Assessment and Plan  53 y.o. male presenting for annual physical.  Health Maintenance counseling: 1. Anticipatory guidance: Patient counseled regarding regular dental exams - q6 months, eye exams - for diabetes yearly,  avoiding smoking and second hand smoke- see below , limiting alcohol to 2 beverages per day - under 14 a week- only drinks 2 days a week- may go a little high one day, no illicit drugs .   2. Risk factor reduction:  Advised patient of need for regular exercise and diet rich and fruits and vegetables to reduce risk of heart attack and stroke.  Exercise- hour 3 x a week- may go a little high impact.  Diet/weight management-up 5 lbs from last year but got as low as 262 before going back to 281- he is motivated to turn this around.  Wt Readings from Last 3 Encounters:  12/09/23 281 lb 9.6 oz (127.7 kg)  11/07/23 279 lb 11.2 oz (126.9 kg)  06/28/23 262 lb 3.2 oz (118.9 kg)  3. Immunizations/screenings/ancillary studies- up to date 0 had COVID shot last fall Immunization History  Administered Date(s) Administered   Influenza,inj,Quad PF,6+ Mos 06/18/2019   Influenza-Unspecified 06/20/2016, 05/07/2018   Moderna Covid-19 Vaccine Bivalent Booster 53yrs & up 06/06/2021   Moderna SARS-COV2 Booster Vaccination 08/12/2020   PFIZER(Purple Top)SARS-COV-2 Vaccination 09/12/2019, 09/30/2019   PNEUMOCOCCAL CONJUGATE-20 05/17/2022   Pneumococcal Polysaccharide-23 04/29/2017   Rabies, IM 01/10/2021, 01/13/2021, 01/17/2021, 01/24/2021   Tdap 03/20/2017   Zoster Recombinant(Shingrix) 05/17/2022, 12/05/2022  4. Prostate cancer screening- low risk prior trend- update psa today   Lab Results  Component  Value Date   PSA 1.14 05/17/2022   PSA 1.25 12/26/2020   PSA 1.44 03/03/2019   5. Colon cancer screening - adenomatous colon polyps x3-May 04, 2022 with plan for 5-year repeat with Dr. Adela Lank  6. Skin cancer screening- lower risk due to melanin content. advised regular sunscreen use. Denies worrisome, changing, or new skin lesions.  7. Smoking associated screening (lung cancer screening, AAA screen 65-75, UA)- current smoker- half pack per day- on work days at 3- still working on cutting out completely- he knows the improtance of this 8. STD screening - only active with wife  Status of chronic or acute concerns   # Diabetes S: Medication:Traditionally diet controlled CBGs- does not check Exercise and diet- feels diet looser and he's concerned- also more friend  food Lab Results  Component Value Date   HGBA1C 6.9 (H) 12/05/2022   HGBA1C 6.6 (H) 05/17/2022   HGBA1C 6.7 (H) 12/26/2020  A/P: hopefully stable- update a1c today. Continue without meds for now - he strongly wants to work on healthy eating and regular exercise    #hypertension S: medication: Amlodipine 10 mg, chlorthalidone 25 mg, lisinopril 40 mg, nebivolol 10 mg, spironolactone 25 mgA/P: well controlled continue current medications    #hyperlipidemia #Aortic atherosclerosis (presumed stable) S: Medication:Rosuvastatin 20 mg 3 times a week (history of edema on statins in the past)- we considered 4 and he tolerated this, aspirin 81 mg Lab Results  Component Value Date   CHOL 162 05/17/2022   HDL 48.10 05/17/2022   LDLCALC 80 05/17/2022   LDLDIRECT 107.0 12/26/2020   TRIG 167.0 (H) 05/17/2022   CHOLHDL 3 05/17/2022  A/P: lipids mildly high- wants to work on healthy eating and regular exercise instead of increasing medications Aortic atherosclerosis (presumed stable)- LDL goal ideally <70 - just slightly above goal  # Palpitations-follows with Dr. Cristal Deer S:Cardiac monitor showed SVT.  Echo, CBC, BMP, TSH  without clear triggers.  SVT occurred despite beta-blocker. Intermittent issues but if hydrates can usually help and is not as frequent A/P: cardiology has said he can see them every 2 years now- he is doing very well   #Thoracic aortic aneurysm without rupture-there was concern about this on echocardiogram but CT angiogram did not confirm-in fact aorta was normal-no further follow-up needed from 2021  #hidradenitis suppurativa - flare in groin below scrotum- several inflammatory pustules without obvious tunneling have been present about a week- We discussed possible doxycycline for hidradenitis suppurativa but with you planning to be out walking a lot we opted to trial topicals and warm compresses- may also be worth mentioning to Dr. Onalee Hua- though they may schedule a different visit -already on spironolactone- could consider metformin add on for diabetes and this   Recommended follow up: Return in about 6 months (around 06/09/2024) for followup or sooner if needed.Schedule b4 you leave.  Lab/Order associations:NOT fasting   ICD-10-CM   1. Preventative health care  Z00.00     2. Screening for prostate cancer  Z12.5 PSA    3. Controlled type 2 diabetes mellitus without complication, without long-term current use of insulin (HCC)  E11.9 Hemoglobin A1c    Microalbumin / creatinine urine ratio    Urinalysis, Routine w reflex microscopic    4. Resistant hypertension  I1A.0     5. Smoker  F17.200 Urinalysis, Routine w reflex microscopic    6. Hyperlipidemia associated with type 2 diabetes mellitus (HCC)  E11.69 Comprehensive metabolic panel with GFR   E78.5 CBC with Differential/Platelet    Lipid panel    Urinalysis, Routine w reflex microscopic    7. Skin cancer screening  Z12.83 Ambulatory referral to Dermatology    8. Hidradenitis suppurativa  L73.2 clindamycin (CLINDAGEL) 1 % gel      Meds ordered this encounter  Medications   clindamycin (CLINDAGEL) 1 % gel    Sig: Apply topically  2 times daily to boils/pimples in groin/axilla for up to 7 days.    Dispense:  60 g    Refill:  3    Return precautions advised.  Tana Conch, MD

## 2023-12-09 NOTE — Patient Instructions (Addendum)
 Team can you please try to get a copy of his diabetes eye exam   Please stop by lab before you go If you have mychart- we will send your results within 3 business days of [place] receiving them.  If you do not have mychart- we will call you about results within 5 business days of [place] receiving them.  *please also note that you will see labs on mychart as soon as they post. I will later go in and write notes on them- will say "notes from Dr. Durene Cal"   We have placed a referral for you today to Dr. Onalee Hua dermatology- please call their # if you do not hear within a week (may be listed below or you may see mychart message within a few days with #).   We discussed possible doxycycline for hidradenitis suppurativa but with you planning to be out walking a lot we opted to trial topical clindamycin and warm compresses- may also be worth mentioning to Dr. Onalee Hua- though they may schedule a different visit  Recommended follow up: Return in about 6 months (around 06/09/2024) for followup or sooner if needed.Schedule b4 you leave. As long as a1c still under 7- otherwise 4 months

## 2023-12-09 NOTE — Assessment & Plan Note (Signed)
#  hidradenitis suppurativa - flare in groin below scrotum- several inflammatory pustules without obvious tunneling have been present about a week- We discussed possible doxycycline for hidradenitis suppurativa but with you planning to be out walking a lot we opted to trial topicals and warm compresses- may also be worth mentioning to Dr. Onalee Hua- though they may schedule a different visit -already on spironolactone- could consider metformin add on for diabetes and this

## 2023-12-13 ENCOUNTER — Other Ambulatory Visit (HOSPITAL_COMMUNITY): Payer: Self-pay

## 2023-12-13 ENCOUNTER — Other Ambulatory Visit: Payer: Self-pay

## 2023-12-13 MED ORDER — ROSUVASTATIN CALCIUM 20 MG PO TABS
ORAL_TABLET | ORAL | 3 refills | Status: DC
  Filled 2023-12-13: qty 65, 13d supply, fill #0
  Filled 2024-02-12 – 2024-02-17 (×2): qty 65, 90d supply, fill #0

## 2023-12-13 MED ORDER — CLINDAMYCIN PHOS (TWICE-DAILY) 1 % EX GEL
CUTANEOUS | 3 refills | Status: AC
  Filled 2023-12-13: qty 60, 30d supply, fill #0

## 2023-12-17 ENCOUNTER — Other Ambulatory Visit: Payer: Self-pay | Admitting: Family Medicine

## 2023-12-17 ENCOUNTER — Other Ambulatory Visit (HOSPITAL_COMMUNITY): Payer: Self-pay

## 2023-12-17 MED ORDER — NEBIVOLOL HCL 10 MG PO TABS
10.0000 mg | ORAL_TABLET | Freq: Every day | ORAL | 1 refills | Status: DC
  Filled 2023-12-17: qty 90, 90d supply, fill #0
  Filled 2024-03-23: qty 90, 90d supply, fill #1

## 2024-01-06 ENCOUNTER — Other Ambulatory Visit (HOSPITAL_COMMUNITY): Payer: Self-pay

## 2024-01-06 ENCOUNTER — Other Ambulatory Visit: Payer: Self-pay | Admitting: Family Medicine

## 2024-01-06 MED ORDER — SPIRONOLACTONE 25 MG PO TABS
12.5000 mg | ORAL_TABLET | Freq: Every day | ORAL | 0 refills | Status: DC
  Filled 2024-01-06: qty 30, 30d supply, fill #0

## 2024-01-14 ENCOUNTER — Other Ambulatory Visit (HOSPITAL_COMMUNITY): Payer: Self-pay

## 2024-01-23 ENCOUNTER — Other Ambulatory Visit (HOSPITAL_COMMUNITY): Payer: Self-pay

## 2024-01-23 MED ORDER — CHLORHEXIDINE GLUCONATE 0.12 % MT SOLN
OROMUCOSAL | 1 refills | Status: AC
  Filled 2024-01-23: qty 473, 16d supply, fill #0

## 2024-01-25 ENCOUNTER — Other Ambulatory Visit (HOSPITAL_COMMUNITY): Payer: Self-pay

## 2024-02-10 ENCOUNTER — Other Ambulatory Visit: Payer: Self-pay | Admitting: Family Medicine

## 2024-02-11 ENCOUNTER — Other Ambulatory Visit (HOSPITAL_COMMUNITY): Payer: Self-pay

## 2024-02-11 MED ORDER — SILDENAFIL CITRATE 20 MG PO TABS
40.0000 mg | ORAL_TABLET | ORAL | 11 refills | Status: AC
  Filled 2024-02-11: qty 75, 30d supply, fill #0
  Filled 2024-09-03: qty 75, 30d supply, fill #1
  Filled 2024-09-07: qty 60, 24d supply, fill #1
  Filled 2024-09-07: qty 15, 6d supply, fill #1

## 2024-02-12 ENCOUNTER — Other Ambulatory Visit (HOSPITAL_COMMUNITY): Payer: Self-pay

## 2024-02-12 ENCOUNTER — Other Ambulatory Visit: Payer: Self-pay | Admitting: Family Medicine

## 2024-02-12 ENCOUNTER — Ambulatory Visit: Admitting: Physician Assistant

## 2024-02-12 MED ORDER — SPIRONOLACTONE 25 MG PO TABS
12.5000 mg | ORAL_TABLET | Freq: Every day | ORAL | 0 refills | Status: DC
  Filled 2024-02-12: qty 30, 30d supply, fill #0

## 2024-02-13 ENCOUNTER — Other Ambulatory Visit (HOSPITAL_COMMUNITY): Payer: Self-pay

## 2024-02-17 ENCOUNTER — Other Ambulatory Visit (HOSPITAL_COMMUNITY): Payer: Self-pay

## 2024-02-19 ENCOUNTER — Ambulatory Visit (INDEPENDENT_AMBULATORY_CARE_PROVIDER_SITE_OTHER): Admitting: Family

## 2024-02-19 ENCOUNTER — Encounter: Payer: Self-pay | Admitting: Family

## 2024-02-19 ENCOUNTER — Other Ambulatory Visit (HOSPITAL_COMMUNITY): Payer: Self-pay

## 2024-02-19 ENCOUNTER — Other Ambulatory Visit: Payer: Self-pay

## 2024-02-19 VITALS — BP 138/90 | HR 84 | Temp 98.2°F | Ht 79.0 in | Wt 277.0 lb

## 2024-02-19 DIAGNOSIS — L03011 Cellulitis of right finger: Secondary | ICD-10-CM | POA: Diagnosis not present

## 2024-02-19 MED ORDER — DOXYCYCLINE HYCLATE 100 MG PO TABS
100.0000 mg | ORAL_TABLET | Freq: Two times a day (BID) | ORAL | 0 refills | Status: AC
Start: 2024-02-19 — End: 2024-02-26
  Filled 2024-02-19 (×2): qty 14, 7d supply, fill #0

## 2024-02-19 NOTE — Progress Notes (Signed)
 Patient ID: Carlos Patterson, male    DOB: 1970-12-20, 53 y.o.   MRN: [id number]  Chief Complaint  Patient presents with   Hand Pain    Right middle finger is severely swollen.   Discussed the use of AI scribe software for clinical note transcription with the patient, who gave verbal consent to proceed.  History of Present Illness Carlos Patterson is a 53 year old male who presents with a painful swollen finger.  Approximately one month ago, he sustained a splinter injury to the left side of his fingernail while handling a wicker hamper. He removed the splinter himself, and initially, his finger swelled for about a day before subsiding. However, about a week and a half ago, the finger became significantly more painful and swollen. He has been soaking the finger in Epsom salt without relief. The pain is severe and worsens with activities such as washing dishes. No fever or systemic symptoms. He denies any known allergies to antibiotics and has not sought urgent care due to higher copay costs.  Assessment & Plan Paronychia - Paronychia of his right middle finger, likely secondary to a splinter injury, with bacterial infection. Incision and drainage performed with significant pus evacuation. Expected symptom relief with continued drainage and antibiotic therapy. - Perform incision and drainage to relieve pressure and evacuate pus. Diagnosis: abscess - Location: right 3rd fingernail Procedure: Incision & drainage Informed consent:  Discussed risks (permanent loss of nail, permanent irregular growth of nail, infection, pain, bleeding, bruising, numbness, and recurrence of the condition) and benefits of the procedure, as well as the alternatives.  Informed consent was obtained. Anesthesia: ethyl alcohol spray The area was prepared and draped in a standard fashion. The lesion drained pus and blood. The patient tolerated the procedure well. The patient was instructed on post-op care.  -  Prescribe doxycycline for one week, twice daily with food, advised on possible SE. - Recommend soaking finger in warm water for up to 10 minutes when home, gently massaging finger distally as tolerated, to encourage further drainage today only. - Advise daily bandage changes, washing with soap and water until drainage ceases. - Suggest ibuprofen or acetaminophen for pain, apply ice if needed after initial day. - Instructed to call office if no improvement in a 2 days.   Subjective:    Outpatient Medications Prior to Visit  Medication Sig Dispense Refill   amLODipine  (NORVASC ) 10 MG tablet Take 1 tablet (10 mg total) by mouth daily. 90 tablet 3   aspirin EC 81 MG tablet Take 81 mg by mouth daily.     chlorhexidine  (PERIDEX ) 0.12 % solution Rinse with 1/2 oz for 30 seconds twice a day.  Spit out.  Do not swallow. 473 mL 1   chlorthalidone  (HYGROTON ) 25 MG tablet TAKE 1 TABLET BY MOUTH ONCE DAILY 90 tablet 3   ciclopirox  (LOPROX ) 0.77 % cream Apply topically 2 (two) times daily as needed.     clindamycin  (CLINDAGEL ) 1 % gel Apply topically twice daily to boils/pimples in groin/axilla for up to 7 days 60 g 3   ketoconazole  (NIZORAL ) 2 % cream Apply 1 Application topically daily. For tinea versicolor - fungal rash 60 g 1   lisinopril  (ZESTRIL ) 40 MG tablet Take 1 tablet (40 mg total) by mouth daily. 90 tablet 2   nebivolol  (BYSTOLIC ) 10 MG tablet Take 1 tablet (10 mg total) by mouth daily. 90 tablet 1   rosuvastatin  (CRESTOR ) 20 MG tablet Take 1 tablet 5 days a  week. 65 tablet 3   sildenafil  (REVATIO ) 20 MG tablet Take 2-5 tablets (40-100 mg total) by mouth every other day for erectile dysfunction 75 tablet 11   spironolactone  (ALDACTONE ) 25 MG tablet Take 0.5-1 tablets (12.5-25 mg total) by mouth daily. 30 tablet 0   No facility-administered medications prior to visit.   Past Medical History:  Diagnosis Date   Acne vulgaris    Chicken pox    Diabetes mellitus    Hyperlipidemia     Hypertension    Past Surgical History:  Procedure Laterality Date   PILONIDAL CYST EXCISION     removal   No Known Allergies    Objective:    Physical Exam Vitals and nursing note reviewed.  Constitutional:      General: He is not in acute distress.    Appearance: Normal appearance.  HENT:     Head: Normocephalic.   Cardiovascular:     Rate and Rhythm: Normal rate and regular rhythm.  Pulmonary:     Effort: Pulmonary effort is normal.     Breath sounds: Normal breath sounds.   Musculoskeletal:        General: Normal range of motion.     Right hand: Swelling (distal middle finger around nail bed and pad of finger), tenderness and bony tenderness present. Normal range of motion.     Cervical back: Normal range of motion.   Skin:    General: Skin is warm and dry.   Neurological:     Mental Status: He is alert and oriented to person, place, and time.   Psychiatric:        Mood and Affect: Mood normal.    BP (!) 138/90   Pulse 84   Temp 98.2 F (36.8 C) (Temporal)   Ht 6' 7 (2.007 m)   Wt 277 lb (125.6 kg)   SpO2 98%   BMI 31.21 kg/m  Wt Readings from Last 3 Encounters:  02/19/24 277 lb (125.6 kg)  12/09/23 281 lb 9.6 oz (127.7 kg)  11/07/23 279 lb 11.2 oz (126.9 kg)      Versa Gore, NP

## 2024-03-23 ENCOUNTER — Other Ambulatory Visit (HOSPITAL_COMMUNITY): Payer: Self-pay

## 2024-03-23 ENCOUNTER — Other Ambulatory Visit: Payer: Self-pay | Admitting: Family Medicine

## 2024-03-23 MED ORDER — SPIRONOLACTONE 25 MG PO TABS
12.5000 mg | ORAL_TABLET | Freq: Every day | ORAL | 0 refills | Status: DC
  Filled 2024-03-23: qty 30, 30d supply, fill #0

## 2024-04-20 ENCOUNTER — Other Ambulatory Visit: Payer: Self-pay | Admitting: Family Medicine

## 2024-04-20 ENCOUNTER — Other Ambulatory Visit (HOSPITAL_COMMUNITY): Payer: Self-pay

## 2024-04-20 MED ORDER — SPIRONOLACTONE 25 MG PO TABS
12.5000 mg | ORAL_TABLET | Freq: Every day | ORAL | 1 refills | Status: DC
  Filled 2024-04-20: qty 30, 30d supply, fill #0
  Filled 2024-05-21: qty 30, 30d supply, fill #1

## 2024-04-20 NOTE — Telephone Encounter (Signed)
 Last OV 02/19/24 Next OV 06/10/24  Last refill 03/23/24 Qty #30/0

## 2024-05-21 ENCOUNTER — Other Ambulatory Visit (HOSPITAL_COMMUNITY): Payer: Self-pay

## 2024-05-22 ENCOUNTER — Other Ambulatory Visit (HOSPITAL_COMMUNITY): Payer: Self-pay

## 2024-05-26 ENCOUNTER — Other Ambulatory Visit (HOSPITAL_COMMUNITY): Payer: Self-pay

## 2024-05-26 ENCOUNTER — Telehealth: Payer: Self-pay

## 2024-05-26 ENCOUNTER — Other Ambulatory Visit: Payer: Self-pay

## 2024-05-26 ENCOUNTER — Other Ambulatory Visit: Payer: Self-pay | Admitting: Family Medicine

## 2024-05-26 MED ORDER — SCOPOLAMINE 1 MG/3DAYS TD PT72
1.0000 | MEDICATED_PATCH | TRANSDERMAL | 0 refills | Status: DC
  Filled 2024-05-26: qty 4, 12d supply, fill #0

## 2024-05-26 MED ORDER — LISINOPRIL 40 MG PO TABS
40.0000 mg | ORAL_TABLET | Freq: Every day | ORAL | 2 refills | Status: AC
  Filled 2024-05-26: qty 90, 90d supply, fill #0
  Filled 2024-09-03 – 2024-09-07 (×2): qty 90, 90d supply, fill #1

## 2024-05-26 NOTE — Telephone Encounter (Signed)
 Copied from CRM [id number]. Topic: Clinical - Medication Question >> May 26, 2024  9:18 AM Jasmin G wrote: Reason for CRM: Pt called to request is he could get prescribed patches that go behind ear to manage motion sickness as he will be travelling next week. Call pt back at [phone] to discuss.

## 2024-05-26 NOTE — Addendum Note (Signed)
 Addended by: KATRINKA GARNETTE KIDD on: 05/26/2024 12:55 PM   Modules accepted: Orders

## 2024-05-26 NOTE — Telephone Encounter (Signed)
 sent

## 2024-06-10 ENCOUNTER — Ambulatory Visit: Admitting: Family Medicine

## 2024-06-10 DIAGNOSIS — H5213 Myopia, bilateral: Secondary | ICD-10-CM | POA: Diagnosis not present

## 2024-06-13 ENCOUNTER — Other Ambulatory Visit (HOSPITAL_COMMUNITY): Payer: Self-pay

## 2024-06-18 ENCOUNTER — Encounter: Payer: Self-pay | Admitting: Family Medicine

## 2024-06-18 ENCOUNTER — Ambulatory Visit (INDEPENDENT_AMBULATORY_CARE_PROVIDER_SITE_OTHER): Admitting: Family Medicine

## 2024-06-18 ENCOUNTER — Ambulatory Visit: Payer: Self-pay | Admitting: Family Medicine

## 2024-06-18 VITALS — BP 130/88 | HR 72 | Temp 97.5°F | Ht 79.0 in | Wt 273.8 lb

## 2024-06-18 DIAGNOSIS — E1169 Type 2 diabetes mellitus with other specified complication: Secondary | ICD-10-CM | POA: Diagnosis not present

## 2024-06-18 DIAGNOSIS — E785 Hyperlipidemia, unspecified: Secondary | ICD-10-CM

## 2024-06-18 DIAGNOSIS — Z23 Encounter for immunization: Secondary | ICD-10-CM

## 2024-06-18 DIAGNOSIS — I1A0 Resistant hypertension: Secondary | ICD-10-CM

## 2024-06-18 DIAGNOSIS — Z794 Long term (current) use of insulin: Secondary | ICD-10-CM

## 2024-06-18 DIAGNOSIS — E119 Type 2 diabetes mellitus without complications: Secondary | ICD-10-CM

## 2024-06-18 LAB — COMPREHENSIVE METABOLIC PANEL WITH GFR
ALT: 24 U/L (ref 0–53)
AST: 19 U/L (ref 0–37)
Albumin: 4.6 g/dL (ref 3.5–5.2)
Alkaline Phosphatase: 99 U/L (ref 39–117)
BUN: 14 mg/dL (ref 6–23)
CO2: 25 meq/L (ref 19–32)
Calcium: 10 mg/dL (ref 8.4–10.5)
Chloride: 101 meq/L (ref 96–112)
Creatinine, Ser: 0.92 mg/dL (ref 0.40–1.50)
GFR: 93.81 mL/min (ref >60.00)
Glucose, Bld: 135 mg/dL — ABNORMAL HIGH (ref 70–99)
Potassium: 3.9 meq/L (ref 3.5–5.1)
Sodium: 135 meq/L (ref 135–145)
Total Bilirubin: 0.6 mg/dL (ref 0.2–1.2)
Total Protein: 7.8 g/dL (ref 6.0–8.3)

## 2024-06-18 LAB — CBC WITH DIFFERENTIAL/PLATELET
Basophils Absolute: 0 K/uL (ref 0.0–0.1)
Basophils Relative: 0.4 % (ref 0.0–3.0)
Eosinophils Absolute: 0.2 K/uL (ref 0.0–0.7)
Eosinophils Relative: 2.3 % (ref 0.0–5.0)
HCT: 43.2 % (ref 39.0–52.0)
Hemoglobin: 14.8 g/dL (ref 13.0–17.0)
Lymphocytes Relative: 32.2 % (ref 12.0–46.0)
Lymphs Abs: 3.2 K/uL (ref 0.7–4.0)
MCHC: 34.3 g/dL (ref 30.0–36.0)
MCV: 90.4 fl (ref 78.0–100.0)
Monocytes Absolute: 0.8 K/uL (ref 0.1–1.0)
Monocytes Relative: 7.6 % (ref 3.0–12.0)
Neutro Abs: 5.7 K/uL (ref 1.4–7.7)
Neutrophils Relative %: 57.5 % (ref 43.0–77.0)
Platelets: 290 K/uL (ref 150.0–400.0)
RBC: 4.78 Mil/uL (ref 4.22–5.81)
RDW: 13.1 % (ref 11.5–15.5)
WBC: 9.9 K/uL (ref 4.0–10.5)

## 2024-06-18 LAB — LDL CHOLESTEROL, DIRECT: Direct LDL: 102 mg/dL

## 2024-06-18 NOTE — Progress Notes (Signed)
 Phone [phone] In person visit   Subjective:   Carlos Patterson is a 53 y.o. year old very pleasant male patient who presents for/with See problem oriented charting Chief Complaint  Patient presents with   Diabetes   Hypertension   Past Medical History-  Patient Active Problem List   Diagnosis Date Noted   Smoker 10/02/2016    Priority: High   Diabetes mellitus type II, controlled (HCC) 05/14/2008    Priority: High   Resistant hypertension 04/29/2007    Priority: High   Aortic atherosclerosis 12/26/2020    Priority: Medium    Palpitations 01/24/2018    Priority: Medium    Hyperlipidemia associated with type 2 diabetes mellitus (HCC) 06/17/2007    Priority: Medium    Erectile dysfunction 07/10/2018    Priority: Low   Hidradenitis suppurativa 02/21/2018    Priority: Low   Tinea versicolor 03/20/2017    Priority: Low   Sebaceous cyst 11/28/2007    Priority: Low   Essential hypertension 06/04/2022   Paroxysmal SVT (supraventricular tachycardia) 01/12/2020    Medications- reviewed and updated Current Outpatient Medications  Medication Sig Dispense Refill   amLODipine  (NORVASC ) 10 MG tablet Take 1 tablet (10 mg total) by mouth daily. 90 tablet 3   aspirin EC 81 MG tablet Take 81 mg by mouth daily.     chlorhexidine  (PERIDEX ) 0.12 % solution Rinse with 1/2 oz for 30 seconds twice a day.  Spit out.  Do not swallow. 473 mL 1   chlorthalidone  (HYGROTON ) 25 MG tablet TAKE 1 TABLET BY MOUTH ONCE DAILY 90 tablet 3   ciclopirox  (LOPROX ) 0.77 % cream Apply topically 2 (two) times daily as needed.     clindamycin  (CLINDAGEL ) 1 % gel Apply topically twice daily to boils/pimples in groin/axilla for up to 7 days 60 g 3   ketoconazole  (NIZORAL ) 2 % cream Apply 1 Application topically daily. For tinea versicolor - fungal rash 60 g 1   lisinopril  (ZESTRIL ) 40 MG tablet Take 1 tablet (40 mg total) by mouth daily. 90 tablet 2   nebivolol  (BYSTOLIC ) 10 MG tablet Take 1 tablet (10 mg  total) by mouth daily. 90 tablet 1   rosuvastatin  (CRESTOR ) 20 MG tablet Take 1 tablet 5 days a week. 65 tablet 3   scopolamine  (TRANSDERM-SCOP) 1 MG/3DAYS Place 1 patch (1 mg total) onto the skin every 3 (three) days. 4 patch 0   sildenafil  (REVATIO ) 20 MG tablet Take 2-5 tablets (40-100 mg total) by mouth every other day for erectile dysfunction 75 tablet 11   spironolactone  (ALDACTONE ) 25 MG tablet Take 0.5-1 tablets (12.5-25 mg total) by mouth daily. 30 tablet 1   No current facility-administered medications for this visit.     Objective:  BP 130/88 (BP Location: Left Arm, Patient Position: Sitting, Cuff Size: Normal)   Pulse 72   Temp (!) 97.5 F (36.4 C) (Temporal)   Ht 6' 7 (2.007 m)   Wt 273 lb 12.8 oz (124.2 kg)   SpO2 98%   BMI 30.84 kg/m  Gen: NAD, resting comfortably Cerumen impaction on right ear CV: RRR no murmurs rubs or gallops Lungs: CTAB no crackles, wheeze, rhonchi Ext: no edema Skin: warm, dry     Assessment and Plan   #illness starting last Thursday- body aches but no fever. Had been traveling and could have gotten ill with travel- cruise with wife but didn't get to go to [place] rerouted to [place]  # Smoking-about 10-11 pack years- encouraged cessation  #  Cerumen impaction-team to attempt irrigation-I was unable to successfully curette all of the material out  # Diabetes S: Medication: Traditionally diet controlled.  CBGs- does not check Exercise and diet- has not made any improvements yet. Down 8 lbs from last visit. Reduced portion sizes. Is urinating a lot including at night. Not more fatigued Lab Results  Component Value Date   HGBA1C 7.4 (H) 12/09/2023   HGBA1C 6.9 (H) 12/05/2022   HGBA1C 6.6 (H) 05/17/2022  A/P: he wants to monitor home sugars- gave herlene three plus device - we discussed ideally want all sugars between 80-180. In morning id like his sugars before food more 80-120.  - if a1c is above 7.5 consider metformin XR 500 mg once  daily   #hypertension S: medication: Amlodipine  10 mg, chlorthalidone  25 mg, lisinopril  40 mg, nebivolol  10 mg, spironolactone  25 mg BP Readings from Last 3 Encounters:  06/18/24 130/88  02/19/24 (!) 138/90  12/09/23 120/70  A/P: well controlled continue current medications    #hyperlipidemia #Aortic atherosclerosis (presumed stable) S: Medication:Rosuvastatin  20 mg 4 times a week (history of edema on statins in the past), aspirin 81 mg  Lab Results  Component Value Date   CHOL 207 (H) 12/09/2023   HDL 43.10 12/09/2023   LDLCALC 89 12/09/2023   LDLDIRECT 107.0 12/26/2020   TRIG 375.0 (H) 12/09/2023   CHOLHDL 5 12/09/2023  A/P: plan had been 5 days a week but they must have been filling the old prescription- we will recheck this today   Recommended follow up: Return for next already scheduled visit or sooner if needed. Future Appointments  Date Time Provider Department Center  12/09/2024 10:00 AM Katrinka Garnette KIDD, MD [facility]   Lab/Order associations:   ICD-10-CM   1. Diabetes mellitus type II, controlled (HCC)  E11.9 Hemoglobin A1c    Amb Referral to Nutrition and Diabetic Education    2. Resistant hypertension  I1A.0     3. Hyperlipidemia associated with type 2 diabetes mellitus (HCC)  E11.69 Comprehensive metabolic panel with GFR   E78.5 CBC with Differential/Platelet    Direct LDL    4. Immunization due  Z23 Flu vaccine trivalent PF, 6mos and older(Flulaval,Afluria,Fluarix,Fluzone)      No orders of the defined types were placed in this encounter.   Return precautions advised.  Garnette Katrinka, MD

## 2024-06-18 NOTE — Patient Instructions (Addendum)
 Thanks for doing flu shot  Team please irrigate right ear  We have placed a referral for you today to diabetic education- please call their # if you do not hear within a week (may be listed below or you may see mychart message within a few days with #).    Please stop by lab before you go If you have mychart- we will send your results within 3 business days of us  receiving them.  If you do not have mychart- we will call you about results within 5 business days of us  receiving them.  *please also note that you will see labs on mychart as soon as they post. I will later go in and write notes on them- will say notes from Dr. Katrinka   he wants to monitor home sugars- gave herlene three plus device - we discussed ideally want all sugars between 80-180. In morning id like his sugars before food more 80-120.  - if a1c is above 7.5 consider metformin XR 500 mg once daily -exercise can help as well with sugar as well as dietary changes  Recommended follow up: Return for next already scheduled visit or sooner if needed. Such as if a1c substantially elevated

## 2024-06-19 ENCOUNTER — Other Ambulatory Visit: Payer: Self-pay

## 2024-06-19 ENCOUNTER — Other Ambulatory Visit (HOSPITAL_COMMUNITY): Payer: Self-pay

## 2024-06-19 LAB — HEMOGLOBIN A1C
Hgb A1c MFr Bld: 6.9 % — ABNORMAL HIGH (ref <5.7)
Mean Plasma Glucose: 151 mg/dL
eAG (mmol/L): 8.4 mmol/L

## 2024-06-19 MED ORDER — ROSUVASTATIN CALCIUM 20 MG PO TABS
ORAL_TABLET | ORAL | 3 refills | Status: AC
  Filled 2024-06-19: qty 65, 90d supply, fill #0
  Filled 2024-09-16: qty 65, 90d supply, fill #1

## 2024-06-20 ENCOUNTER — Other Ambulatory Visit (HOSPITAL_COMMUNITY): Payer: Self-pay

## 2024-06-20 ENCOUNTER — Other Ambulatory Visit: Payer: Self-pay | Admitting: Family Medicine

## 2024-06-22 ENCOUNTER — Other Ambulatory Visit (HOSPITAL_COMMUNITY): Payer: Self-pay

## 2024-06-22 MED ORDER — CHLORTHALIDONE 25 MG PO TABS
25.0000 mg | ORAL_TABLET | Freq: Every day | ORAL | 3 refills | Status: AC
  Filled 2024-06-22: qty 90, 90d supply, fill #0
  Filled 2024-09-18: qty 90, 90d supply, fill #1

## 2024-07-05 ENCOUNTER — Other Ambulatory Visit: Payer: Self-pay | Admitting: Family Medicine

## 2024-07-06 ENCOUNTER — Other Ambulatory Visit (HOSPITAL_COMMUNITY): Payer: Self-pay

## 2024-07-06 ENCOUNTER — Other Ambulatory Visit: Payer: Self-pay

## 2024-07-06 MED ORDER — NEBIVOLOL HCL 10 MG PO TABS
10.0000 mg | ORAL_TABLET | Freq: Every day | ORAL | 1 refills | Status: AC
  Filled 2024-07-06: qty 90, 90d supply, fill #0

## 2024-07-06 MED ORDER — SPIRONOLACTONE 25 MG PO TABS
12.5000 mg | ORAL_TABLET | Freq: Every day | ORAL | 1 refills | Status: DC
  Filled 2024-07-06: qty 30, 30d supply, fill #0
  Filled 2024-08-14: qty 30, 30d supply, fill #1

## 2024-07-28 ENCOUNTER — Telehealth: Payer: Self-pay | Admitting: Family Medicine

## 2024-07-28 MED ORDER — SCOPOLAMINE 1 MG/3DAYS TD PT72
1.0000 | MEDICATED_PATCH | TRANSDERMAL | 0 refills | Status: AC
  Filled 2024-07-28: qty 4, 12d supply, fill #0

## 2024-07-28 NOTE — Telephone Encounter (Signed)
 Please see patient message and advise.   Copied from CRM #[id number]. Topic: Clinical - Medication Question >> Jul 28, 2024  9:49 AM Carlos Patterson wrote: Reason for CRM: Patient stated he's going on a cruise and he needs the sea sick patch

## 2024-07-28 NOTE — Telephone Encounter (Signed)
 Sent!

## 2024-07-28 NOTE — Addendum Note (Signed)
 Addended by: KATRINKA GARNETTE KIDD on: 07/28/2024 07:55 PM   Modules accepted: Orders

## 2024-07-29 ENCOUNTER — Other Ambulatory Visit (HOSPITAL_COMMUNITY): Payer: Self-pay

## 2024-07-29 ENCOUNTER — Other Ambulatory Visit: Payer: Self-pay

## 2024-09-07 ENCOUNTER — Other Ambulatory Visit (HOSPITAL_COMMUNITY): Payer: Self-pay

## 2024-09-16 ENCOUNTER — Other Ambulatory Visit (HOSPITAL_COMMUNITY): Payer: Self-pay

## 2024-09-18 ENCOUNTER — Other Ambulatory Visit (HOSPITAL_COMMUNITY): Payer: Self-pay

## 2024-09-19 ENCOUNTER — Other Ambulatory Visit: Payer: Self-pay | Admitting: Family Medicine

## 2024-09-21 ENCOUNTER — Other Ambulatory Visit (HOSPITAL_COMMUNITY): Payer: Self-pay

## 2024-09-21 MED ORDER — SPIRONOLACTONE 25 MG PO TABS
12.5000 mg | ORAL_TABLET | Freq: Every day | ORAL | 1 refills | Status: AC
  Filled 2024-09-21: qty 30, 30d supply, fill #0

## 2024-09-23 ENCOUNTER — Other Ambulatory Visit (HOSPITAL_COMMUNITY): Payer: Self-pay

## 2024-10-06 ENCOUNTER — Other Ambulatory Visit: Payer: Self-pay

## 2024-12-09 ENCOUNTER — Encounter: Admitting: Family Medicine
